# Patient Record
Sex: Female | Born: 1955 | Race: White | Hispanic: No | Smoking: Never smoker
Health system: Southern US, Community
[De-identification: ages and names within clinical notes are randomized; demographics above are authoritative.]

## PROBLEM LIST (undated history)

## (undated) DIAGNOSIS — Z808 Family history of malignant neoplasm of other organs or systems: Secondary | ICD-10-CM

## (undated) DIAGNOSIS — Z923 Personal history of irradiation: Secondary | ICD-10-CM

## (undated) DIAGNOSIS — G43109 Migraine with aura, not intractable, without status migrainosus: Secondary | ICD-10-CM

## (undated) DIAGNOSIS — K589 Irritable bowel syndrome without diarrhea: Secondary | ICD-10-CM

## (undated) DIAGNOSIS — Z800 Family history of malignant neoplasm of digestive organs: Secondary | ICD-10-CM

## (undated) DIAGNOSIS — M1990 Unspecified osteoarthritis, unspecified site: Secondary | ICD-10-CM

## (undated) DIAGNOSIS — C50919 Malignant neoplasm of unspecified site of unspecified female breast: Secondary | ICD-10-CM

## (undated) DIAGNOSIS — I4719 Other supraventricular tachycardia: Secondary | ICD-10-CM

## (undated) DIAGNOSIS — I493 Ventricular premature depolarization: Secondary | ICD-10-CM

## (undated) DIAGNOSIS — Z803 Family history of malignant neoplasm of breast: Secondary | ICD-10-CM

## (undated) DIAGNOSIS — I471 Supraventricular tachycardia: Secondary | ICD-10-CM

## (undated) HISTORY — PX: BREAST LUMPECTOMY: SHX2

## (undated) HISTORY — DX: Other supraventricular tachycardia: I47.19

## (undated) HISTORY — DX: Unspecified osteoarthritis, unspecified site: M19.90

## (undated) HISTORY — DX: Migraine with aura, not intractable, without status migrainosus: G43.109

## (undated) HISTORY — DX: Family history of malignant neoplasm of other organs or systems: Z80.8

## (undated) HISTORY — DX: Irritable bowel syndrome, unspecified: K58.9

## (undated) HISTORY — DX: Ventricular premature depolarization: I49.3

## (undated) HISTORY — DX: Family history of malignant neoplasm of breast: Z80.3

## (undated) HISTORY — DX: Family history of malignant neoplasm of digestive organs: Z80.0

## (undated) HISTORY — DX: Supraventricular tachycardia: I47.1

---
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
## 1978-05-22 HISTORY — PX: UPPER GASTROINTESTINAL ENDOSCOPY: SHX188

## 1981-05-22 HISTORY — PX: TUBAL LIGATION: SHX77

## 1999-12-19 ENCOUNTER — Other Ambulatory Visit: Admission: RE | Admit: 1999-12-19 | Discharge: 1999-12-19 | Payer: Self-pay | Admitting: Gynecology

## 2001-01-03 ENCOUNTER — Other Ambulatory Visit: Admission: RE | Admit: 2001-01-03 | Discharge: 2001-01-03 | Payer: Self-pay | Admitting: Gynecology

## 2002-03-25 ENCOUNTER — Other Ambulatory Visit: Admission: RE | Admit: 2002-03-25 | Discharge: 2002-03-25 | Payer: Self-pay | Admitting: Gynecology

## 2003-12-04 ENCOUNTER — Other Ambulatory Visit: Admission: RE | Admit: 2003-12-04 | Discharge: 2003-12-04 | Payer: Self-pay | Admitting: Gynecology

## 2003-12-07 ENCOUNTER — Other Ambulatory Visit: Admission: RE | Admit: 2003-12-07 | Discharge: 2003-12-07 | Payer: Self-pay | Admitting: Gynecology

## 2004-10-03 ENCOUNTER — Other Ambulatory Visit: Admission: RE | Admit: 2004-10-03 | Discharge: 2004-10-03 | Payer: Self-pay | Admitting: Gynecology

## 2005-05-11 ENCOUNTER — Other Ambulatory Visit: Admission: RE | Admit: 2005-05-11 | Discharge: 2005-05-11 | Payer: Self-pay | Admitting: Gynecology

## 2006-02-23 ENCOUNTER — Inpatient Hospital Stay (HOSPITAL_COMMUNITY): Admission: EM | Admit: 2006-02-23 | Discharge: 2006-02-24 | Payer: Self-pay | Admitting: Emergency Medicine

## 2006-02-23 ENCOUNTER — Encounter (INDEPENDENT_AMBULATORY_CARE_PROVIDER_SITE_OTHER): Payer: Self-pay | Admitting: Specialist

## 2006-02-23 HISTORY — PX: APPENDECTOMY: SHX54

## 2006-08-24 ENCOUNTER — Encounter: Admission: RE | Admit: 2006-08-24 | Discharge: 2006-08-24 | Payer: Self-pay | Admitting: Gynecology

## 2007-10-28 ENCOUNTER — Encounter: Admission: RE | Admit: 2007-10-28 | Discharge: 2007-10-28 | Payer: Self-pay | Admitting: Gynecology

## 2009-09-24 ENCOUNTER — Encounter: Admission: RE | Admit: 2009-09-24 | Discharge: 2009-09-24 | Payer: Self-pay | Admitting: Gynecology

## 2010-10-03 ENCOUNTER — Other Ambulatory Visit: Payer: Self-pay | Admitting: Gynecology

## 2010-10-07 NOTE — H&P (Signed)
Samantha Livingston, [name] NO.:  [id number]   MEDICAL RECORD NO.:  [id number]          PATIENT TYPE:  EMS   LOCATION:  MAJO                         FACILITY:  [facility]   PHYSICIAN:  Cherylynn Ridges, M.D.    DATE OF BIRTH:  1955/07/18   DATE OF ADMISSION:  02/23/2006  DATE OF DISCHARGE:                                HISTORY & PHYSICAL   PRIMARY CARE PHYSICIAN:  Lenon Curt. Chilton Si, M.D.   CHIEF COMPLAINT:  Abdominal pain.   HISTORY OF PRESENT ILLNESS:  Samantha Livingston is a 55 year old female patient  with a history of migraines, awakened at 12:00 a.m. this morning with a  sharp periumbilical pain radiating down to the suprapubic region.  She had  intractable nausea and vomiting, so her husband brought her to the ER.  By  this time, her pain had localized to the right lower quadrant.  Workup was  initiated.  Urinalysis was negative.  White count was 11,300.  CT of the  abdomen and pelvis was performed and demonstrated acute appendicitis.  Surgical evaluation has been requested.   REVIEW OF SYSTEMS:  She has had nausea and vomiting, as noted.  She is  having chills.  Her last BM was about 2:00 a.m.  She last ate prior to  midnight.   SOCIAL HISTORY:  No alcohol, no tobacco.  She is married.  She works as a  Veterinary surgeon.   FAMILY HISTORY:  Noncontributory.   PAST MEDICAL HISTORY:  Migraines.   PAST SURGICAL HISTORY:  Tubal ligation.   ALLERGIES:  NKDA.   CURRENT MEDICATIONS:  Maxalt 5 mg p.r.n. migraine.   PHYSICAL EXAMINATION:  GENERAL:  A pleasant female patient complaining of  right lower quadrant abdominal pain with recent nausea and vomiting.  VITAL SIGNS:  Temp 98, BP 105/60, pulse 58 and regular, respirations 18.  NEURO:  Patient is alert and oriented x3.  Moving all extremities x4.  No  focal deficits.  HEENT:  Head is normocephalic.  Sclerae are noninjected.  Oral mucous  membranes are dry.  NECK:  Supple, no adenopathy.  CHEST:  Bilateral lung sounds are clear  to auscultation.  She is on room  air.  CARDIAC:  S1 and S2.  No murmurs, thrills, or gallops.  Pulses regular.  ABDOMEN:  Soft.  Bowel sounds diminished, nondistended.  She is tender in  the right lower quadrant over McBurney's point with guarding or rebound.  EXTREMITIES:  Symmetrical in appearance.  No clubbing, cyanosis or edema.  Pulses palpable.   LABS:  Sodium 139, potassium 4.6, CO2 24, BUN 17, creatinine 1.6.  White  count 11,300, hemoglobin 15.6, platelets 223,000, neutrophils 74%.  Urinalysis negative.   Diagnostic CT of the abdomen and pelvis demonstrates acute appendicitis.  Incidental finding of a 6 mm lung nodule, right lower lobe.  The radiologist  recommends followup in three months and a 4 mm nonspecific right hepatic  lobe hypodensity.   IMPRESSION:  1. Acute appendicitis.  2. Leukocytosis.  3. A 6 mm right lower lobe pulmonary nodule with recommendations to follow      up  in three months.   PLAN:  Will admit the patient to [facility], n.p.o. status.  Unasyn IV empirically.  IV fluids.  Morphine for pain.  Zofran for nausea.  Plans for laparoscopic  appendectomy today.      Allison L. Rennis Harding, N.P.      Cherylynn Ridges, M.D.  Electronically Signed    ALE/MEDQ  D:  02/23/2006  T:  02/24/2006  Job:  [id number]   cc:   Lenon Curt. Chilton Si, M.D.

## 2010-10-07 NOTE — Op Note (Signed)
Samantha Livingston, Samantha Livingston              ACCOUNT NO.:  [id number]   MEDICAL RECORD NO.:  [id number]          PATIENT TYPE:  INP   LOCATION:  5733                         FACILITY:  [facility]   PHYSICIAN:  Cherylynn Ridges, M.D.    DATE OF BIRTH:  Apr 09, 1956   DATE OF PROCEDURE:  02/23/2006  DATE OF DISCHARGE:                                 OPERATIVE REPORT   PREOPERATIVE DIAGNOSIS:  Acute appendicitis.   POSTOPERATIVE DIAGNOSIS:  Acute appendicitis.   PROCEDURE:  Laparoscopic appendectomy.   SURGEON:  Cherylynn Ridges, M.D.   ASSISTANT:  Kelle Darting. Rennis Harding, N.P.   ANESTHESIA:  General endotracheal.   ESTIMATED BLOOD LOSS:  Less than 20 mL.   COMPLICATIONS:  None.   CONDITION:  Stable.   FINDINGS:  Acute suppurative appendicitis without a perforation.   INDICATIONS FOR OPERATION:  The patient is a 55 year old otherwise healthy  female with abdominal pain localized to the right lower quadrant who comes  in with acute appendicitis.  This was diagnosed by CT scan.   FINDINGS:  The patient had acute suppurative appendicitis without  perforation.   OPERATION:  The patient was taken to the operating room and placed on the  table in the supine position.  After an adequate endotracheal anesthetic was  administered, she was prepped and draped in the usual sterile manner,  exposing the midline and the right upper quadrant.   A supraumbilical curvilinear incision was made using a #11 blade and taken  down to the midline fascia.  We grabbed the fascia with the Kocher clamps  and then made a fascial incision between the clamps down into the  preperitoneal space.  We opened into the peritoneal cavity bluntly using a  Kelly clamp and then passed an 0 Vicryl pursestring suture around the  fascial opening in which we secured the Hassan cannula which was  subsequently passed without event.  Through the [facility] cannula, carbon  dioxide gas was insufflated up to maximal intra-abdominal pressure of 15  mmHg.  Once this was done, two right costal margin 5-mm cannula and a  suprapubic 12-mm cannula were passed under direct vision.  Once all cannulas  were in place, the patient was placed into  Trendelenburg, and the left side  was tilted down.   The acutely inflamed appendix could be seen coursing along the medial aspect  the pelvic brim.  We were able to mobilize it up at the cecal base where  after creating a window between the mesoappendix and appendix, we passed an  Endo GIA 3.5-mm closure stapler across the base.  Once this was done, a 2.5-  mm closure Endo-GIA was passed across the mesoappendix, with some smaller  vessels being cauterized with the grasper.  Once the appendix was completely  detached, we were able to pull it out through the suprapubic cannula site  using an EndoCatch bag without contamination of the skin and subcutaneous  tissue.   Once this was done, we reinserted the suprapubic cannula, irrigated with  saline solution.  Up to a liter and a half of solution was used.  Once  this  was done, we aspirated above the liver and in the pelvis of all fluid and  removed all cannulas.  The supraumbilical site was closed at the fascial  level using the 0 Vicryl suture which was then placed to secure the cannula.  Once that was done, the skin at all sites was closed using running  subcuticular stitch of 5-0 Vicryl, and 0.25% Marcaine with epinephrine was  injected at all sites.  Sterile dressings were applied.      Cherylynn Ridges, M.D.  Electronically Signed     JOW/MEDQ  D:  02/23/2006  T:  02/25/2006  Job:  [id number]

## 2011-03-16 ENCOUNTER — Other Ambulatory Visit: Payer: Self-pay | Admitting: Gynecology

## 2012-01-04 ENCOUNTER — Other Ambulatory Visit: Payer: Self-pay | Admitting: Gynecology

## 2012-01-04 DIAGNOSIS — R928 Other abnormal and inconclusive findings on diagnostic imaging of breast: Secondary | ICD-10-CM

## 2012-01-04 LAB — HM MAMMOGRAPHY: HM Mammogram: ABNORMAL

## 2012-01-05 ENCOUNTER — Ambulatory Visit
Admission: RE | Admit: 2012-01-05 | Discharge: 2012-01-05 | Disposition: A | Discharge status: Home / Self Care | Payer: BC Managed Care – PPO | Source: Ambulatory Visit | Attending: Gynecology | Admitting: Gynecology

## 2012-01-05 ENCOUNTER — Other Ambulatory Visit: Payer: Self-pay

## 2012-01-05 DIAGNOSIS — R928 Other abnormal and inconclusive findings on diagnostic imaging of breast: Secondary | ICD-10-CM

## 2012-01-10 ENCOUNTER — Other Ambulatory Visit: Payer: Self-pay

## 2013-02-10 ENCOUNTER — Other Ambulatory Visit: Payer: Self-pay | Admitting: Gynecology

## 2013-02-13 ENCOUNTER — Other Ambulatory Visit: Payer: Self-pay | Admitting: Physician Assistant

## 2013-02-25 ENCOUNTER — Encounter: Payer: Self-pay | Admitting: Internal Medicine

## 2013-02-26 ENCOUNTER — Ambulatory Visit (INDEPENDENT_AMBULATORY_CARE_PROVIDER_SITE_OTHER): Payer: BC Managed Care – PPO | Admitting: Internal Medicine

## 2013-02-26 ENCOUNTER — Encounter: Payer: Self-pay | Admitting: Internal Medicine

## 2013-02-26 VITALS — BP 126/84 | HR 73 | Wt 133.0 lb

## 2013-02-26 DIAGNOSIS — G4700 Insomnia, unspecified: Secondary | ICD-10-CM

## 2013-02-26 DIAGNOSIS — L299 Pruritus, unspecified: Secondary | ICD-10-CM

## 2013-02-26 DIAGNOSIS — E785 Hyperlipidemia, unspecified: Secondary | ICD-10-CM

## 2013-02-26 DIAGNOSIS — Z23 Encounter for immunization: Secondary | ICD-10-CM

## 2013-02-26 DIAGNOSIS — G43909 Migraine, unspecified, not intractable, without status migrainosus: Secondary | ICD-10-CM

## 2013-02-26 MED ORDER — CETIRIZINE HCL 10 MG PO TABS: 10.0000 mg | ORAL_TABLET | Freq: Every day | ORAL | Status: DC

## 2013-02-26 MED ORDER — DIPHENHYDRAMINE HCL 25 MG PO TABS: ORAL_TABLET | ORAL | Status: DC

## 2013-02-26 NOTE — Progress Notes (Signed)
Subjective:    Patient ID: Samantha Livingston, female    DOB: 11-18-1955, 57 y.o.   MRN: [id number]  Chief Complaint  Patient presents with  . Medication Refill    renew migraine medicaiton   . Pruritis    back and lower legs x 4-5 weeks, seen dermatologist- was rx'ed prednisone (completed course)     HPI Pruritus without rash. Benadryl helps at night.  Having frequent night sweats. No fever.  Current Outpatient Prescriptions on File Prior to Visit  Medication Sig Dispense Refill  . Multiple Vitamin (MULTIVITAMIN) tablet Take 1 tablet by mouth daily.      . rizatriptan (MAXALT) 10 MG tablet Take 10 mg by mouth as needed for migraine. May repeat in 2 hours if needed       No current facility-administered medications on file prior to visit.   Immunization History  Administered Date(s) Administered  . Influenza,inj,Quad PF,36+ Mos 02/26/2013  . Influenza-Generic 02/07/2011, 02/27/2012   Past Medical History  Diagnosis Date  . Migraine with aura, without mention of intractable migraine without mention of status migrainosus   . Irritable bowel syndrome    Past Surgical History  Procedure Laterality Date  . Tonsillectomy  1969  . Tubal ligation  1983  . Appendectomy  02-23-2006  . Upper gastrointestinal endoscopy  1980   PROCEDURES 1980 upper GI: Hypersecretion March 2005 CT abdomen and pelvis: Benign renal cyst, ovarian cyst April 2005 renal ultrasound: Right renal cyst 2005 MRI brain: Normal  06/07/2004 2-D echocardiogram: Ejection fraction greater than 55% 2006 Holter: Unremarkable 2006 stress Cardiolite: Normal 02/23/2006 CT abdomen: Acute appendicitis  CONSULTANTS Cardiology: [facility] Headache: Neale Burly GYNGreta Doom  History   Social History  . Marital Status: Unknown    Spouse Name: N/A    Number of Children: N/A  . Years of Education: N/A   Occupational History  . Not on file.   Social History Main Topics  . Smoking status: Never Smoker   . Smokeless  tobacco: Not on file  . Alcohol Use: No  . Drug Use: No  . Sexual Activity: Not on file   Other Topics Concern  . Not on file   Social History Narrative  . No narrative on file    Review of Systems  Constitutional: Positive for fatigue.  HENT: Negative.   Eyes: Negative.   Respiratory: Negative.   Cardiovascular: Negative.   Gastrointestinal: Negative.   Endocrine: Negative.   Genitourinary: Negative.   Musculoskeletal: Negative.   Skin:       Generalized itching. No rash or sores. Has seen Dr. Terri Piedra.  Allergic/Immunologic: Negative.   Neurological:       History migraine headache. Currently controlled.  Hematological: Negative.   Psychiatric/Behavioral: Negative.        Objective:BP 126/84  Pulse 73  Wt 133 lb (60.328 kg)  SpO2 98%    Physical Exam  Constitutional: She is oriented to person, place, and time. She appears well-nourished. No distress.  HENT:  Head: Normocephalic and atraumatic.  Right Ear: External ear normal.  Left Ear: External ear normal.  Nose: Nose normal.  Mouth/Throat: Oropharynx is clear and moist.  Eyes: Conjunctivae and EOM are normal. Pupils are equal, round, and reactive to light.  Neck: No JVD present. No tracheal deviation present. No thyromegaly present.  Cardiovascular: Normal rate, regular rhythm, normal heart sounds and intact distal pulses.  Exam reveals no gallop and no friction rub.   No murmur heard. Pulmonary/Chest: No respiratory distress. She has no wheezes.  She has no rales.  Abdominal: She exhibits distension. She exhibits no mass. There is no tenderness.  Musculoskeletal: Normal range of motion. She exhibits edema. She exhibits no tenderness.  Lymphadenopathy:    She has no cervical adenopathy.  Neurological: She is alert and oriented to person, place, and time. No cranial nerve deficit. Coordination normal.  Skin: No rash noted. No erythema. No pallor.  Psychiatric: She has a normal mood and affect. Her behavior is  normal. Judgment and thought content normal.     02/24/2005 EKG: Rate 58. Normal sinus rhythm. Normal EKG. 03/19/2012 CBC: Normal  CMP: Normal  Lipids: TC 224, trig 93, HDL 63, LDL 142    Assessment & Plan:  Pruritus - Plan: cetirizine (ZYRTEC) 10 MG tablet, CBC With differential/Platelet, Comprehensive metabolic panel, TSH, Sedimentation Rate  Migraine, unspecified, without mention of intractable migraine without mention of status migrainosus  Insomnia, unspecified - Plan: diphenhydrAMINE (BENADRYL) 25 MG tablet  Need for prophylactic vaccination and inoculation against influenza: administered  Hyperlipidemia: Recheck lab at future visit.

## 2013-02-26 NOTE — Patient Instructions (Signed)
Try Zyrtec (cetirizine) for itching.

## 2013-02-27 LAB — CBC WITH DIFFERENTIAL
Basos: 0 %
Eos: 1 %
HCT: 43 % (ref 34.0–46.6)
Hemoglobin: 14.9 g/dL (ref 11.1–15.9)
Lymphocytes Absolute: 3.2 10*3/uL — ABNORMAL HIGH (ref 0.7–3.1)
MCH: 29.9 pg (ref 26.6–33.0)
MCV: 86 fL (ref 79–97)
Monocytes Absolute: 0.6 10*3/uL (ref 0.1–0.9)
Monocytes: 7 %
Neutrophils Absolute: 4.5 10*3/uL (ref 1.4–7.0)
Platelets: 321 10*3/uL (ref 150–379)
RBC: 4.99 x10E6/uL (ref 3.77–5.28)

## 2013-02-27 LAB — COMPREHENSIVE METABOLIC PANEL
AST: 15 IU/L (ref 0–40)
Albumin/Globulin Ratio: 2.2 (ref 1.1–2.5)
Albumin: 4.8 g/dL (ref 3.5–5.5)
Alkaline Phosphatase: 85 IU/L (ref 39–117)
BUN/Creatinine Ratio: 16 (ref 9–23)
BUN: 13 mg/dL (ref 6–24)
Creatinine, Ser: 0.83 mg/dL (ref 0.57–1.00)
GFR calc non Af Amer: 80 mL/min/{1.73_m2} (ref >59)
Globulin, Total: 2.2 g/dL (ref 1.5–4.5)
Sodium: 141 mmol/L (ref 134–144)

## 2013-02-27 LAB — SEDIMENTATION RATE: Sed Rate: 5 mm/hr (ref 0–40)

## 2013-03-07 DIAGNOSIS — G43909 Migraine, unspecified, not intractable, without status migrainosus: Secondary | ICD-10-CM | POA: Insufficient documentation

## 2013-03-07 DIAGNOSIS — E785 Hyperlipidemia, unspecified: Secondary | ICD-10-CM | POA: Insufficient documentation

## 2013-03-07 DIAGNOSIS — L299 Pruritus, unspecified: Secondary | ICD-10-CM | POA: Insufficient documentation

## 2013-04-04 ENCOUNTER — Other Ambulatory Visit: Payer: Self-pay | Admitting: Nurse Practitioner

## 2013-04-22 ENCOUNTER — Other Ambulatory Visit: Payer: Self-pay | Admitting: Physician Assistant

## 2013-10-16 DIAGNOSIS — L908 Other atrophic disorders of skin: Secondary | ICD-10-CM

## 2013-10-16 DIAGNOSIS — L988 Other specified disorders of the skin and subcutaneous tissue: Secondary | ICD-10-CM | POA: Insufficient documentation

## 2013-10-24 ENCOUNTER — Other Ambulatory Visit: Payer: Self-pay | Admitting: *Deleted

## 2013-10-24 DIAGNOSIS — Z0000 Encounter for general adult medical examination without abnormal findings: Secondary | ICD-10-CM

## 2013-11-14 ENCOUNTER — Other Ambulatory Visit: Payer: BC Managed Care – PPO

## 2013-11-14 DIAGNOSIS — Z0000 Encounter for general adult medical examination without abnormal findings: Secondary | ICD-10-CM

## 2013-11-15 LAB — CBC WITH DIFFERENTIAL/PLATELET
Basophils Absolute: 0.1 10*3/uL (ref 0.0–0.2)
Basos: 1 %
EOS: 2 %
Eosinophils Absolute: 0.2 10*3/uL (ref 0.0–0.4)
HEMATOCRIT: 41.3 % (ref 34.0–46.6)
Hemoglobin: 14.7 g/dL (ref 11.1–15.9)
IMMATURE GRANULOCYTES: 0 %
Immature Grans (Abs): 0 10*3/uL (ref 0.0–0.1)
Lymphocytes Absolute: 2.3 10*3/uL (ref 0.7–3.1)
Lymphs: 31 %
MCH: 30.4 pg (ref 26.6–33.0)
MCHC: 35.6 g/dL (ref 31.5–35.7)
MCV: 85 fL (ref 79–97)
MONOCYTES: 7 %
Monocytes Absolute: 0.6 10*3/uL (ref 0.1–0.9)
Neutrophils Absolute: 4.4 10*3/uL (ref 1.4–7.0)
Neutrophils Relative %: 59 %
RBC: 4.84 x10E6/uL (ref 3.77–5.28)
RDW: 13.6 % (ref 12.3–15.4)
WBC: 7.5 10*3/uL (ref 3.4–10.8)

## 2013-11-15 LAB — COMPREHENSIVE METABOLIC PANEL
ALBUMIN: 4.7 g/dL (ref 3.5–5.5)
ALK PHOS: 104 IU/L (ref 39–117)
ALT: 17 IU/L (ref 0–32)
AST: 16 IU/L (ref 0–40)
Albumin/Globulin Ratio: 2.1 (ref 1.1–2.5)
BUN / CREAT RATIO: 20 (ref 9–23)
BUN: 16 mg/dL (ref 6–24)
CHLORIDE: 101 mmol/L (ref 97–108)
CO2: 26 mmol/L (ref 18–29)
Calcium: 9.3 mg/dL (ref 8.7–10.2)
Creatinine, Ser: 0.82 mg/dL (ref 0.57–1.00)
GFR calc Af Amer: 93 mL/min/{1.73_m2} (ref >59)
GFR calc non Af Amer: 81 mL/min/{1.73_m2} (ref >59)
Globulin, Total: 2.2 g/dL (ref 1.5–4.5)
Glucose: 83 mg/dL (ref 65–99)
Potassium: 4.3 mmol/L (ref 3.5–5.2)
Sodium: 141 mmol/L (ref 134–144)
Total Bilirubin: 0.6 mg/dL (ref 0.0–1.2)
Total Protein: 6.9 g/dL (ref 6.0–8.5)

## 2013-11-15 LAB — LIPID PANEL
Chol/HDL Ratio: 3.8 ratio units (ref 0.0–4.4)
Cholesterol, Total: 269 mg/dL — ABNORMAL HIGH (ref 100–199)
HDL: 70 mg/dL (ref >39)
LDL Calculated: 176 mg/dL — ABNORMAL HIGH (ref 0–99)
TRIGLYCERIDES: 113 mg/dL (ref 0–149)
VLDL Cholesterol Cal: 23 mg/dL (ref 5–40)

## 2013-11-18 ENCOUNTER — Ambulatory Visit (INDEPENDENT_AMBULATORY_CARE_PROVIDER_SITE_OTHER): Payer: BC Managed Care – PPO | Admitting: Internal Medicine

## 2013-11-18 ENCOUNTER — Encounter: Payer: Self-pay | Admitting: Internal Medicine

## 2013-11-18 VITALS — BP 120/80 | HR 67 | Temp 98.0°F | Resp 18 | Wt 135.2 lb

## 2013-11-18 DIAGNOSIS — Z13820 Encounter for screening for osteoporosis: Secondary | ICD-10-CM

## 2013-11-18 DIAGNOSIS — E785 Hyperlipidemia, unspecified: Secondary | ICD-10-CM

## 2013-11-18 DIAGNOSIS — G43909 Migraine, unspecified, not intractable, without status migrainosus: Secondary | ICD-10-CM

## 2013-11-18 DIAGNOSIS — L299 Pruritus, unspecified: Secondary | ICD-10-CM

## 2013-11-18 DIAGNOSIS — Z1211 Encounter for screening for malignant neoplasm of colon: Secondary | ICD-10-CM

## 2013-11-18 MED ORDER — LORAZEPAM 1 MG PO TABS: ORAL_TABLET | ORAL | Status: DC

## 2013-11-18 MED ORDER — TRIAMCINOLONE ACETONIDE 0.1 % EX CREA: TOPICAL_CREAM | CUTANEOUS | Status: DC | PRN

## 2013-11-18 MED ORDER — RIZATRIPTAN BENZOATE 10 MG PO TABS: ORAL_TABLET | ORAL | Status: DC

## 2013-11-18 MED ORDER — PRAVASTATIN SODIUM 20 MG PO TABS: ORAL_TABLET | ORAL | Status: DC

## 2013-11-18 NOTE — Progress Notes (Signed)
Patient ID: Samantha Livingston, female   DOB: 01-07-1956, 58 y.o.   MRN: [id number]    Location:    [facility]  Place of Service:  OFFICE  Extended Emergency Contact Information Primary Emergency Contact: [facility] Address: [place],  [place] Home Phone: [phone] Relation: None   Chief Complaint  Patient presents with  . Annual Exam    HPI:  Patient feels like she been doing well physically. Her husband died a few months ago. She is still grieving, but is not overly depressed. He had been ill for many months and his death was expected.  Hyperlipidemia - never treated in the past. History of CVC in mother and maternal GF, MI in maternal GM. Multiple family members with known HLD.  Migraine, unspecified, without mention of intractable migraine without mention of status migrainosus - headaches are not as frequent as in the past. Maxalt is still effective.  Pruritus - improved. Continues to use triamcinolone cream (KENALOG) 0.1 % if she feels the itching is relapsing.  Has never had colonoscopy  Has never had Bone Density    Past Medical History  Diagnosis Date  . Migraine with aura, without mention of intractable migraine without mention of status migrainosus   . Irritable bowel syndrome     Past Surgical History  Procedure Laterality Date  . Tonsillectomy  1969  . Tubal ligation  1983  . Appendectomy  02-23-2006  . Upper gastrointestinal endoscopy  1980    History   Social History  . Marital Status: Unknown    Spouse Name: N/A    Number of Children: N/A  . Years of Education: N/A   Occupational History  . Not on file.   Social History Main Topics  . Smoking status: Never Smoker   . Smokeless tobacco: Not on file  . Alcohol Use: No  . Drug Use: No  . Sexual Activity: Not on file   Other Topics Concern  . Not on file   Social History Narrative   Husband died 46 after a prolonged illness. Married 20 years. Her 2nd marriage and his  37th.   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     reports that she has never smoked. She does not have any smokeless tobacco history on file. She reports that she does not drink alcohol or use illicit drugs.  Immunization History  Administered Date(s) Administered  . Influenza,inj,Quad PF,36+ Mos 02/26/2013  . Influenza-Unspecified 02/07/2011, 02/27/2012    No Known Allergies  Medications: Patient's Medications  New Prescriptions   No medications on file  Previous Medications   LORAZEPAM (ATIVAN) 1 MG TABLET    Take 1 mg by mouth as needed.    MULTIPLE VITAMIN (MULTIVITAMIN) TABLET    Take 1 tablet by mouth daily.   RIZATRIPTAN (MAXALT) 10 MG TABLET    TAKE 1 TABLET AT ONSET OF MIGRAINE HEADACHES   TRIAMCINOLONE CREAM (KENALOG) 0.1 %      Modified Medications   No medications on file  Discontinued Medications   CETIRIZINE (ZYRTEC) 10 MG TABLET    Take 1 tablet (10 mg total) by mouth daily.   DIPHENHYDRAMINE (BENADRYL) 25 MG TABLET    One at bed if needed for rest   HYDROXYZINE (ATARAX/VISTARIL) 10 MG TABLET       METRONIDAZOLE (FLAGYL) 500 MG TABLET         Review of Systems  Constitutional: Positive for fatigue.  HENT: Negative.   Eyes: Negative.   Respiratory: Negative.   Cardiovascular: Negative.   Gastrointestinal: Negative.   Endocrine: Negative.   Genitourinary: Negative.   Musculoskeletal: Negative.   Skin:       Generalized itching. No rash or sores. Has seen Dr. Allyson Sabal.  Allergic/Immunologic: Negative.   Neurological:       History migraine headache. Currently controlled.  Hematological: Negative.   Psychiatric/Behavioral: Negative.     Filed Vitals:   11/18/13 1406  BP: 120/80  Pulse: 67  Temp: 98 F (36.7 C)  TempSrc: Oral  Resp: 18  Weight: 135 lb 3.2 oz (61.326 kg)  SpO2: 99%   There is no height on file to calculate BMI.  Physical Exam  Constitutional: She is oriented to person, place, and time. She appears  well-nourished. No distress.  HENT:  Head: Normocephalic and atraumatic.  Right Ear: External ear normal.  Left Ear: External ear normal.  Nose: Nose normal.  Mouth/Throat: Oropharynx is clear and moist.  Eyes: Conjunctivae and EOM are normal. Pupils are equal, round, and reactive to light.  Neck: No JVD present. No tracheal deviation present. No thyromegaly present.  Cardiovascular: Normal rate, regular rhythm, normal heart sounds and intact distal pulses.  Exam reveals no gallop and no friction rub.   No murmur heard. Pulmonary/Chest: No respiratory distress. She has no wheezes. She has no rales.  Abdominal: She exhibits no distension and no mass. There is no tenderness.  Genitourinary:  Done by GYN  Musculoskeletal: Normal range of motion. She exhibits no edema and no tenderness.  Lymphadenopathy:    She has no cervical adenopathy.  Neurological: She is alert and oriented to person, place, and time. No cranial nerve deficit. Coordination normal.  Skin: No rash noted. No erythema. No pallor.  Psychiatric: She has a normal mood and affect. Her behavior is normal. Judgment and thought content normal.     Labs reviewed: Appointment on 11/14/2013  Component Date Value Ref Range Status  . Glucose 11/14/2013 83  65 - 99 mg/dL Final  . BUN 11/14/2013 16  6 - 24 mg/dL Final  . Creatinine, Ser 11/14/2013 0.82  0.57 - 1.00 mg/dL Final  . GFR calc non Af Amer 11/14/2013 81  >59 mL/min/1.73 Final  . GFR calc Af Amer 11/14/2013 93  >59 mL/min/1.73 Final  . BUN/Creatinine Ratio 11/14/2013 20  9 - 23 Final  . Sodium 11/14/2013 141  134 - 144 mmol/L Final  . Potassium 11/14/2013 4.3  3.5 - 5.2 mmol/L Final  . Chloride 11/14/2013 101  97 - 108 mmol/L Final  . CO2 11/14/2013 26  18 - 29 mmol/L Final  . Calcium 11/14/2013 9.3  8.7 - 10.2 mg/dL Final  . Total Protein 11/14/2013 6.9  6.0 - 8.5 g/dL Final  . Albumin 11/14/2013 4.7  3.5 - 5.5 g/dL Final  . Globulin, Total 11/14/2013 2.2  1.5 - 4.5  g/dL Final  . Albumin/Globulin Ratio 11/14/2013 2.1  1.1 - 2.5 Final  . Total Bilirubin 11/14/2013 0.6  0.0 - 1.2 mg/dL Final  . Alkaline Phosphatase 11/14/2013 104  39 - 117 IU/L Final  . AST 11/14/2013 16  0 - 40 IU/L Final  . ALT 11/14/2013 17  0 - 32 IU/L Final  . WBC 11/14/2013 7.5  3.4 - 10.8 x10E3/uL Final  . RBC 11/14/2013 4.84  3.77 - 5.28 x10E6/uL Final  . Hemoglobin 11/14/2013 14.7  11.1 - 15.9 g/dL Final  . HCT 11/14/2013 41.3  34.0 -  46.6 % Final  . MCV 11/14/2013 85  79 - 97 fL Final  . MCH 11/14/2013 30.4  26.6 - 33.0 pg Final  . MCHC 11/14/2013 35.6  31.5 - 35.7 g/dL Final  . RDW 11/14/2013 13.6  12.3 - 15.4 % Final  . Neutrophils Relative % 11/14/2013 59   Final  . Lymphs 11/14/2013 31   Final  . Monocytes 11/14/2013 7   Final  . Eos 11/14/2013 2   Final  . Basos 11/14/2013 1   Final  . Neutrophils Absolute 11/14/2013 4.4  1.4 - 7.0 x10E3/uL Final  . Lymphocytes Absolute 11/14/2013 2.3  0.7 - 3.1 x10E3/uL Final  . Monocytes Absolute 11/14/2013 0.6  0.1 - 0.9 x10E3/uL Final  . Eosinophils Absolute 11/14/2013 0.2  0.0 - 0.4 x10E3/uL Final  . Basophils Absolute 11/14/2013 0.1  0.0 - 0.2 x10E3/uL Final  . Immature Granulocytes 11/14/2013 0   Final  . Immature Grans (Abs) 11/14/2013 0.0  0.0 - 0.1 x10E3/uL Final  . Cholesterol, Total 11/14/2013 269* 100 - 199 mg/dL Final  . Triglycerides 11/14/2013 113  0 - 149 mg/dL Final  . HDL 11/14/2013 70  >39 mg/dL Final   Comment: According to ATP-III Guidelines, HDL-C >59 mg/dL is considered a                          negative risk factor for CHD.  Marland Kitchen VLDL Cholesterol Cal 11/14/2013 23  5 - 40 mg/dL Final  . LDL Calculated 11/14/2013 176* 0 - 99 mg/dL Final  . Chol/HDL Ratio 11/14/2013 3.8  0.0 - 4.4 ratio units Final   Comment:                                   T. Chol/HDL Ratio                                                                      Men  Women                                                        1/2 Avg.Risk   3.4    3.3                                                            Avg.Risk  5.0    4.4                                                         2X Avg.Risk  9.6    7.1  3X Avg.Risk 23.4   11.0    11/18/13 EKG: rate 60. NSR. Normal EKG  Assessment/Plan   1. Hyperlipidemia Start treatment - pravastatin (PRAVACHOL) 20 MG tablet; One daily to lower cholesterol  Dispense: 90 tablet; Refill: 3 - Lipid panel; Future  2. Migraine, unspecified, without mention of intractable migraine without mention of status migrainosus - rizatriptan (MAXALT) 10 MG tablet; TAKE 1 TABLET AT ONSET OF MIGRAINE HEADACHES  Dispense: 8 tablet; Refill: 5 - LORazepam (ATIVAN) 1 MG tablet; One up to 3 times daily if needed for anxiety  Dispense: 30 tablet; Refill: 4  3. Pruritus - triamcinolone cream (KENALOG) 0.1 %; Apply topically as needed.  Dispense: 30 g; Refill: 2  4. Encounter for screening colonoscopy - Ambulatory referral to Gastroenterology  5. Osteoporosis screening - DG Bone Density; Future

## 2013-11-19 ENCOUNTER — Encounter: Payer: Self-pay | Admitting: Internal Medicine

## 2013-12-05 ENCOUNTER — Encounter: Payer: Self-pay | Admitting: Internal Medicine

## 2013-12-09 ENCOUNTER — Telehealth: Payer: Self-pay | Admitting: *Deleted

## 2013-12-09 NOTE — Telephone Encounter (Signed)
Dr. Nyoka Cowden received a note from [facility] stating they have not been able to get into contact with patient to schedule a colonoscopy. [facility] stated that patient will not return their calls. I called patient and spoke with her regarding this and she stated [place] and [place] them and schedule an appointment.

## 2014-02-13 ENCOUNTER — Other Ambulatory Visit: Payer: BC Managed Care – PPO

## 2014-02-17 ENCOUNTER — Ambulatory Visit: Payer: BC Managed Care – PPO | Admitting: Internal Medicine

## 2014-02-19 ENCOUNTER — Other Ambulatory Visit: Payer: Self-pay | Admitting: Gynecology

## 2014-02-23 LAB — CYTOLOGY - PAP

## 2014-03-27 ENCOUNTER — Other Ambulatory Visit: Payer: BC Managed Care – PPO

## 2014-03-27 DIAGNOSIS — E785 Hyperlipidemia, unspecified: Secondary | ICD-10-CM

## 2014-03-28 LAB — LIPID PANEL
CHOL/HDL RATIO: 4.2 ratio (ref 0.0–4.4)
Cholesterol, Total: 261 mg/dL — ABNORMAL HIGH (ref 100–199)
HDL: 62 mg/dL (ref >39)
LDL Calculated: 167 mg/dL — ABNORMAL HIGH (ref 0–99)
TRIGLYCERIDES: 160 mg/dL — AB (ref 0–149)
VLDL CHOLESTEROL CAL: 32 mg/dL (ref 5–40)

## 2014-03-31 ENCOUNTER — Encounter: Payer: Self-pay | Admitting: Internal Medicine

## 2014-03-31 ENCOUNTER — Ambulatory Visit (INDEPENDENT_AMBULATORY_CARE_PROVIDER_SITE_OTHER): Payer: BC Managed Care – PPO | Admitting: Internal Medicine

## 2014-03-31 ENCOUNTER — Ambulatory Visit (INDEPENDENT_AMBULATORY_CARE_PROVIDER_SITE_OTHER): Payer: BC Managed Care – PPO | Admitting: *Deleted

## 2014-03-31 VITALS — BP 116/70 | HR 66 | Temp 97.7°F | Resp 10 | Wt 139.0 lb

## 2014-03-31 DIAGNOSIS — E785 Hyperlipidemia, unspecified: Secondary | ICD-10-CM

## 2014-03-31 DIAGNOSIS — G43009 Migraine without aura, not intractable, without status migrainosus: Secondary | ICD-10-CM

## 2014-03-31 DIAGNOSIS — Z23 Encounter for immunization: Secondary | ICD-10-CM

## 2014-03-31 DIAGNOSIS — L299 Pruritus, unspecified: Secondary | ICD-10-CM

## 2014-03-31 MED ORDER — RIZATRIPTAN BENZOATE 10 MG PO TABS: ORAL_TABLET | ORAL | Status: DC

## 2014-03-31 MED ORDER — PRAVASTATIN SODIUM 20 MG PO TABS: ORAL_TABLET | ORAL | Status: DC

## 2014-03-31 NOTE — Progress Notes (Signed)
Patient ID: Samantha Livingston, female   DOB: [birth year], 58 y.o.   MRN: [id number]    Facility  [facility]    Place of Service:   OFFICE   No Known Allergies  Chief Complaint  Patient presents with  . Medical Management of Chronic Issues    3 Month follow-up     HPI:  Hyperlipidemia: She did not take pravastatin. Her brother did not tolerate Crestor due to muscle aches.  Migraine without aura and without status migrainosus, not intractable: improved  Pruritus: improved    Medications: Patient's Medications  New Prescriptions   No medications on file  Previous Medications   LORAZEPAM (ATIVAN) 1 MG TABLET    One up to 3 times daily if needed for anxiety   MULTIPLE VITAMIN (MULTIVITAMIN) TABLET    Take 1 tablet by mouth daily.   PRAVASTATIN (PRAVACHOL) 20 MG TABLET    One daily to lower cholesterol   RIZATRIPTAN (MAXALT) 10 MG TABLET    TAKE 1 TABLET AT ONSET OF MIGRAINE HEADACHES   TRIAMCINOLONE CREAM (KENALOG) 0.1 %    Apply topically as needed.  Modified Medications   No medications on file  Discontinued Medications   No medications on file     Review of Systems  Constitutional: Positive for fatigue.  HENT: Negative.   Eyes: Negative.   Respiratory: Negative.   Cardiovascular: Negative.   Gastrointestinal: Negative.   Endocrine: Negative.   Genitourinary: Negative.   Musculoskeletal: Negative.   Skin:       Generalized itching. No rash or sores. Has seen Dr. Allyson Sabal.  Allergic/Immunologic: Negative.   Neurological:       History migraine headache. Currently controlled.  Hematological: Negative.   Psychiatric/Behavioral: Negative.     Filed Vitals:   03/31/14 1349  BP: 116/70  Pulse: 66  Temp: 97.7 F (36.5 C)  TempSrc: Oral  Resp: 10  Weight: 139 lb (63.05 kg)  SpO2: 99%   There is no height on file to calculate BMI.  Physical Exam  Constitutional: She is oriented to person, place, and time. She appears well-nourished. No distress.  HENT:  Head:  Normocephalic and atraumatic.  Right Ear: External ear normal.  Left Ear: External ear normal.  Nose: Nose normal.  Mouth/Throat: Oropharynx is clear and moist.  Eyes: Conjunctivae and EOM are normal. Pupils are equal, round, and reactive to light.  Neck: No JVD present. No tracheal deviation present. No thyromegaly present.  Cardiovascular: Normal rate, regular rhythm, normal heart sounds and intact distal pulses.  Exam reveals no gallop and no friction rub.   No murmur heard. Pulmonary/Chest: No respiratory distress. She has no wheezes. She has no rales.  Abdominal: She exhibits no distension and no mass. There is no tenderness.  Genitourinary:  Done by GYN  Musculoskeletal: Normal range of motion. She exhibits no edema or tenderness.  Lymphadenopathy:    She has no cervical adenopathy.  Neurological: She is alert and oriented to person, place, and time. No cranial nerve deficit. Coordination normal.  Skin: No rash noted. No erythema. No pallor.  Psychiatric: She has a normal mood and affect. Her behavior is normal. Judgment and thought content normal.     Labs reviewed: Appointment on 03/27/2014  Component Date Value Ref Range Status  . Cholesterol, Total 03/27/2014 261* 100 - 199 mg/dL Final                 **Please note reference interval change**  . Triglycerides 03/27/2014 160* 0 - 149  mg/dL Final                 **Please note reference interval change**  . HDL 03/27/2014 62  >39 mg/dL Final   Comment: According to ATP-III Guidelines, HDL-C >59 mg/dL is considered a negative risk factor for CHD.   Marland Kitchen VLDL Cholesterol Cal 03/27/2014 32  5 - 40 mg/dL Final  . LDL Calculated 03/27/2014 167* 0 - 99 mg/dL Final                 **Please note reference interval change**  . Chol/HDL Ratio 03/27/2014 4.2  0.0 - 4.4 ratio units Final   Comment:                                   T. Chol/HDL Ratio                                             Men  Women                                1/2 Avg.Risk  3.4    3.3                                   Avg.Risk  5.0    4.4                                2X Avg.Risk  9.6    7.1                                3X Avg.Risk 23.4   11.0   Orders Only on 02/19/2014  Component Date Value Ref Range Status  . CYTOLOGY - PAP 02/19/2014 PAP RESULT   Final     Assessment/Plan  1. Hyperlipidemia Gave samples of Livalo to see if she tolerates statins. Switch to pravastain if she tolerates livalo. - pravastatin (PRAVACHOL) 20 MG tablet; One daily to lower cholesterol  Dispense: 30 tablet; Refill: 3 - Lipid panel; Future  2. Migraine without aura and without status migrainosus, not intractable - rizatriptan (MAXALT) 10 MG tablet; TAKE 1 TABLET AT ONSET OF MIGRAINE HEADACHES  Dispense: 8 tablet; Refill: 5  3. Pruritus improved

## 2014-05-12 ENCOUNTER — Other Ambulatory Visit: Payer: Self-pay | Admitting: Obstetrics & Gynecology

## 2014-09-28 ENCOUNTER — Other Ambulatory Visit: Payer: BC Managed Care – PPO

## 2014-09-29 ENCOUNTER — Ambulatory Visit: Payer: BC Managed Care – PPO | Admitting: Internal Medicine

## 2014-12-24 ENCOUNTER — Other Ambulatory Visit: Payer: Self-pay | Admitting: Internal Medicine

## 2015-01-26 ENCOUNTER — Ambulatory Visit (INDEPENDENT_AMBULATORY_CARE_PROVIDER_SITE_OTHER): Payer: Self-pay | Admitting: Internal Medicine

## 2015-01-26 ENCOUNTER — Encounter: Payer: Self-pay | Admitting: Internal Medicine

## 2015-01-26 VITALS — BP 110/74 | HR 61 | Temp 98.1°F | Resp 14 | Ht 66.0 in | Wt 136.0 lb

## 2015-01-26 DIAGNOSIS — Z803 Family history of malignant neoplasm of breast: Secondary | ICD-10-CM

## 2015-01-26 DIAGNOSIS — E785 Hyperlipidemia, unspecified: Secondary | ICD-10-CM

## 2015-01-26 DIAGNOSIS — G43009 Migraine without aura, not intractable, without status migrainosus: Secondary | ICD-10-CM

## 2015-01-26 DIAGNOSIS — Z23 Encounter for immunization: Secondary | ICD-10-CM

## 2015-01-26 NOTE — Progress Notes (Signed)
Patient ID: Female Samantha Livingston, female   DOB: 03-24-1956, 59 y.o.   MRN: [id number]    Facility  [facility]    Place of Service:   OFFICE    No Known Allergies  Chief Complaint  Patient presents with  . Medical Management of Chronic Issues    10 month follow-up    HPI:  Patient asked questions as to whether she should have genetic testing because her mother had breast cancer that is now metastatic to bone there is additional family history of breast cancer in her maternal grandfather's sister (great aunt). A maternal aunt had stomach cancer. Her maternal grandfather had throat cancer. Patient is unaware as to whether mother ever had testing for breast cancer with associated genetic mutations (BRCA).  Migraine headaches are still present but are much fewer and less intense than in the past. He continues to benefit from the use of Maxalt. She is now getting headaches about 3 times per month.  Last lipid panel was in November 2015. It showed an elevated LDL. She has not been retested since then. She is not fasting at the time of today's visit.  Medications: Patient's Medications  New Prescriptions   No medications on file  Previous Medications   LORAZEPAM (ATIVAN) 1 MG TABLET    TAKE 1 TABLET BY MOUTH UP TO THREE TIMES A DAY IF NEEDED FOR ANXIETY   MULTIPLE VITAMIN (MULTIVITAMIN) TABLET    Take 1 tablet by mouth daily.   RIZATRIPTAN (MAXALT) 10 MG TABLET    TAKE 1 TABLET AT ONSET OF MIGRAINE HEADACHES  Modified Medications   No medications on file  Discontinued Medications   PRAVASTATIN (PRAVACHOL) 20 MG TABLET    One daily to lower cholesterol   TRIAMCINOLONE CREAM (KENALOG) 0.1 %    Apply topically as needed.   Family History  Problem Relation Age of Onset  . Hypertension Brother   . Hypertension Brother   . Hypertension Brother   . Migraines Daughter   . Breast cancer Mother     2009  . Bone cancer Mother     05/2013  . Cancer Mother     Breast with metastatic disease to bone  .  Cancer Maternal Aunt     Stomach  . Cancer Maternal Grandfather     Throat  . Cancer Maternal Aunt     Cancer of the breast     Review of Systems  Constitutional: Positive for fatigue.  HENT: Negative.   Eyes: Negative.   Respiratory: Negative.   Cardiovascular: Negative.   Gastrointestinal: Negative.   Endocrine: Negative.   Genitourinary: Negative.   Musculoskeletal: Negative.   Skin:       Generalized itching. No rash or sores. Has seen Dr. Allyson Sabal.  Allergic/Immunologic: Negative.   Neurological:       History migraine headache. Currently controlled.  Hematological: Negative.   Psychiatric/Behavioral: Negative.     Filed Vitals:   01/26/15 1641  BP: 110/74  Pulse: 61  Temp: 98.1 F (36.7 C)  TempSrc: Oral  Resp: 14  Height: $Remove'5\' 6"'NVcavbD$  (1.676 m)  Weight: 136 lb (61.689 kg)  SpO2: 97%   Body mass index is 21.96 kg/(m^2).  Physical Exam  Constitutional: She is oriented to person, place, and time. She appears well-nourished. No distress.  HENT:  Head: Normocephalic and atraumatic.  Right Ear: External ear normal.  Left Ear: External ear normal.  Nose: Nose normal.  Mouth/Throat: Oropharynx is clear and moist.  Eyes: Conjunctivae and EOM are normal.  Pupils are equal, round, and reactive to light.  Neck: No JVD present. No tracheal deviation present. No thyromegaly present.  Cardiovascular: Normal rate, regular rhythm, normal heart sounds and intact distal pulses.  Exam reveals no gallop and no friction rub.   No murmur heard. Pulmonary/Chest: No respiratory distress. She has no wheezes. She has no rales.  Abdominal: She exhibits no distension and no mass. There is no tenderness.  Genitourinary:  Done by GYN  Musculoskeletal: Normal range of motion. She exhibits no edema or tenderness.  Lymphadenopathy:    She has no cervical adenopathy.  Neurological: She is alert and oriented to person, place, and time. No cranial nerve deficit. Coordination normal.  Skin: No  rash noted. No erythema. No pallor.  Psychiatric: She has a normal mood and affect. Her behavior is normal. Judgment and thought content normal.     Labs reviewed: Lab Summary Latest Ref Rng 03/27/2014 11/14/2013 02/26/2013  Hemoglobin 11.1 - 15.9 g/dL (None) 14.7 14.9  Hematocrit 34.0 - 46.6 % (None) 41.3 43.0  White count 3.4 - 10.8 x10E3/uL (None) 7.5 8.4  Platelet count 150 - 379 x10E3/uL (None) (None) 321  Sodium 134 - 144 mmol/L (None) 141 141  Potassium 3.5 - 5.2 mmol/L (None) 4.3 4.0  Calcium 8.7 - 10.2 mg/dL (None) 9.3 9.5  Phosphorus - (None) (None) (None)  Creatinine 0.57 - 1.00 mg/dL (None) 0.82 0.83  AST 0 - 40 IU/L (None) 16 15  Alk Phos 39 - 117 IU/L (None) 104 85  Bilirubin 0.0 - 1.2 mg/dL (None) 0.6 0.6  Glucose 65 - 99 mg/dL (None) 83 89  Cholesterol - (None) (None) (None)  HDL cholesterol >39 mg/dL 62 70 (None)  Triglycerides 0 - 149 mg/dL 160(H) 113 (None)  LDL Direct - (None) (None) (None)  LDL Calc 0 - 99 mg/dL 167(H) 176(H) (None)  Total protein - (None) (None) (None)  Albumin 3.5 - 5.5 g/dL (None) 4.7 4.8   Lab Results  Component Value Date   TSH 1.350 02/26/2013   Lab Results  Component Value Date   BUN 16 11/14/2013        Assessment/Plan  1. Family history of breast cancer in mother I initially ordered the BRCA genetic testing, but patient is not insured at the present time and did not want to do this test due to a cost of $700. She is to check with her mother as to whether or not genetic testing has been done. She will return next year when she has insurance and we will consider reordering genetic testing at that time.  2. Migraine without aura and without status migrainosus, not intractable Continue Maxalt  3. Hyperlipidemia Recommended lipid panel prior to next visit

## 2015-01-28 ENCOUNTER — Encounter: Payer: Self-pay | Admitting: Internal Medicine

## 2015-07-27 ENCOUNTER — Ambulatory Visit: Payer: Self-pay | Admitting: Internal Medicine

## 2015-08-27 ENCOUNTER — Other Ambulatory Visit: Payer: Self-pay | Admitting: Internal Medicine

## 2015-08-30 ENCOUNTER — Other Ambulatory Visit: Payer: Self-pay | Admitting: Obstetrics & Gynecology

## 2015-08-30 LAB — HM MAMMOGRAPHY: HM Mammogram: NORMAL (ref 0–4)

## 2015-08-30 LAB — HM PAP SMEAR: HM PAP: NORMAL

## 2015-08-31 LAB — CYTOLOGY - PAP

## 2015-09-15 ENCOUNTER — Ambulatory Visit: Payer: Self-pay | Admitting: Internal Medicine

## 2015-10-20 ENCOUNTER — Telehealth: Payer: Self-pay | Admitting: *Deleted

## 2015-10-20 NOTE — Telephone Encounter (Signed)
Patient daughter, Lenna Sciara called and stated that her mother fell at the [place] in [facility] and hit her head, face and neck pretty hard. They went to ER but left because of long wait time. Daughter called here and wanted to make an appointment with Dr. Nyoka Cowden for next week but I advised her to go ahead and go to an Urgent Care to be evaluated today due to trauma and patient complaining about her neck hurting.  Daughter agreed.

## 2015-10-21 NOTE — Telephone Encounter (Signed)
I agree with this advice

## 2016-01-21 ENCOUNTER — Other Ambulatory Visit: Payer: Self-pay | Admitting: Internal Medicine

## 2016-04-11 ENCOUNTER — Ambulatory Visit: Payer: Self-pay

## 2016-04-28 DIAGNOSIS — Z719 Counseling, unspecified: Secondary | ICD-10-CM | POA: Insufficient documentation

## 2016-09-06 ENCOUNTER — Encounter: Payer: Self-pay | Admitting: Internal Medicine

## 2016-09-06 ENCOUNTER — Ambulatory Visit (INDEPENDENT_AMBULATORY_CARE_PROVIDER_SITE_OTHER): Payer: Self-pay | Admitting: Internal Medicine

## 2016-09-06 DIAGNOSIS — G4700 Insomnia, unspecified: Secondary | ICD-10-CM | POA: Insufficient documentation

## 2016-09-06 DIAGNOSIS — F4321 Adjustment disorder with depressed mood: Secondary | ICD-10-CM | POA: Insufficient documentation

## 2016-09-06 DIAGNOSIS — F4320 Adjustment disorder, unspecified: Secondary | ICD-10-CM

## 2016-09-06 MED ORDER — LORAZEPAM 1 MG PO TABS: ORAL_TABLET | ORAL | 0 refills | Status: DC

## 2016-09-06 NOTE — Progress Notes (Signed)
Facility  [facility]    Place of Service:   OFFICE    Allergies  Allergen Reactions  . Sulfur Rash    Chief Complaint  Patient presents with  . Medical Management of Chronic Issues    last seen 01/26/2015, wants a refill on Ativan. Mother died 03-08-16, having trouble sleeping    HPI:  Mother died 03/07/2016. Having some trouble sleeping. No issues during the day. Says she has poured herself into her business of [employer].  Migraines have not been a problem lately.  Surgery on the jaw for pain by Dr. Reginia Naas of [facility] in [place].    Medications: Patient's Medications  New Prescriptions   No medications on file  Previous Medications   LORAZEPAM (ATIVAN) 1 MG TABLET    TAKE 1 TABLET BY MOUTH UP TO THREE TIMES A DAY IF NEEDED FOR ANXIETY   MULTIPLE VITAMIN (MULTIVITAMIN) TABLET    Take 1 tablet by mouth daily.   RIZATRIPTAN (MAXALT) 10 MG TABLET    TAKE 1 TABLET AT ONSET OF MIGRAINE HEADACHES  Modified Medications   No medications on file  Discontinued Medications   No medications on file    Review of Systems  Constitutional: Positive for fatigue.  HENT: Negative.   Eyes: Negative.   Respiratory: Negative.   Cardiovascular: Negative.   Gastrointestinal: Negative.   Endocrine: Negative.   Genitourinary: Negative.   Musculoskeletal: Negative.   Skin:       Generalized itching. No rash or sores. Has seen Dr. Allyson Sabal.  Allergic/Immunologic: Negative.   Neurological:       History migraine headache. Currently controlled.  Hematological: Negative.   Psychiatric/Behavioral: Negative.     Vitals:   09/06/16 1415  BP: 108/74  Pulse: 60  Temp: 97.8 F (36.6 C)  TempSrc: Oral  SpO2: 98%  Weight: 138 lb (62.6 kg)  Height: '5\' 6"'$  (1.676 m)   Body mass index is 22.27 kg/m. Wt Readings from Last 3 Encounters:  09/06/16 138 lb (62.6 kg)  01/26/15 136 lb (61.7 kg)  03/31/14 139 lb (63 kg)      Physical Exam    Constitutional: She is oriented to person, place, and time. She appears well-nourished. No distress.  HENT:  Head: Normocephalic and atraumatic.  Right Ear: External ear normal.  Left Ear: External ear normal.  Nose: Nose normal.  Mouth/Throat: Oropharynx is clear and moist.  Eyes: Conjunctivae and EOM are normal. Pupils are equal, round, and reactive to light.  Neck: No JVD present. No tracheal deviation present. No thyromegaly present.  Cardiovascular: Normal rate, regular rhythm, normal heart sounds and intact distal pulses.  Exam reveals no gallop and no friction rub.   No murmur heard. Pulmonary/Chest: No respiratory distress. She has no wheezes. She has no rales.  Abdominal: She exhibits no distension and no mass. There is no tenderness.  Genitourinary:  Genitourinary Comments: Done by GYN  Musculoskeletal: Normal range of motion. She exhibits no edema or tenderness.  Lymphadenopathy:    She has no cervical adenopathy.  Neurological: She is alert and oriented to person, place, and time. No cranial nerve deficit. Coordination normal.  Skin: No rash noted. No erythema. No pallor.  Psychiatric: She has a normal mood and affect. Her behavior is normal. Judgment and thought content normal.    Labs reviewed: Lab Summary Latest Ref Rng & Units 03/27/2014 11/14/2013 03-07-13  Hemoglobin 11.1 - 15.9 g/dL (None) 14.7 14.9  Hematocrit 34.0 -  46.6 % (None) 41.3 43.0  White count 3.4 - 10.8 x10E3/uL (None) 7.5 8.4  Platelet count 150 - 379 x10E3/uL (None) (None) 321  Sodium 134 - 144 mmol/L (None) 141 141  Potassium 3.5 - 5.2 mmol/L (None) 4.3 4.0  Calcium 8.7 - 10.2 mg/dL (None) 9.3 9.5  Phosphorus - (None) (None) (None)  Creatinine 0.57 - 1.00 mg/dL (None) 0.82 0.83  AST 0 - 40 IU/L (None) 16 15  Alk Phos 39 - 117 IU/L (None) 104 85  Bilirubin 0.0 - 1.2 mg/dL (None) 0.6 0.6  Glucose 65 - 99 mg/dL (None) 83 89  Cholesterol - (None) (None) (None)  HDL cholesterol >39 mg/dL 62 70  (None)  Triglycerides 0 - 149 mg/dL 160(H) 113 (None)  LDL Direct - (None) (None) (None)  LDL Calc 0 - 99 mg/dL 167(H) 176(H) (None)  Total protein - (None) (None) (None)  Albumin 3.5 - 5.5 g/dL (None) 4.7 4.8  Some recent data might be hidden   Lab Results  Component Value Date   TSH 1.350 02/26/2013   Lab Results  Component Value Date   BUN 16 11/14/2013   BUN 13 02/26/2013   No results found for: HGBA1C  Assessment/Plan  1. Grief - LORazepam (ATIVAN) 1 MG tablet; TAKE 1 TABLET BY MOUTH UP TO THREE TIMES A DAY IF NEEDED FOR ANXIETY OR SLEEP  Dispense: 30 tablet; Refill: 0  2. Insomnia, unspecified type - LORazepam (ATIVAN) 1 MG tablet; TAKE 1 TABLET BY MOUTH UP TO THREE TIMES A DAY IF NEEDED FOR ANXIETY OR SLEEP  Dispense: 30 tablet; Refill: 0

## 2016-10-03 ENCOUNTER — Other Ambulatory Visit: Payer: Self-pay | Admitting: *Deleted

## 2016-10-03 MED ORDER — RIZATRIPTAN BENZOATE 10 MG PO TABS: ORAL_TABLET | ORAL | 5 refills | Status: DC

## 2016-10-03 NOTE — Telephone Encounter (Signed)
Patient requested to be sent to pharmacy.  

## 2016-10-13 ENCOUNTER — Telehealth: Payer: Self-pay | Admitting: *Deleted

## 2016-10-13 NOTE — Telephone Encounter (Signed)
Patient called and stated that she had gotten a tick bite last Saturday and she still feels like the head of the tick is still there. Patient stated that there is a red ring around the site. I instructed patient to go to Urgent care to be evaluated. Patient agreed.

## 2016-10-23 ENCOUNTER — Encounter: Payer: Self-pay | Admitting: Nurse Practitioner

## 2016-10-23 ENCOUNTER — Ambulatory Visit (INDEPENDENT_AMBULATORY_CARE_PROVIDER_SITE_OTHER): Payer: Self-pay | Admitting: Nurse Practitioner

## 2016-10-23 VITALS — BP 118/76 | HR 60 | Temp 97.9°F | Resp 17 | Ht 66.0 in | Wt 135.8 lb

## 2016-10-23 DIAGNOSIS — K148 Other diseases of tongue: Secondary | ICD-10-CM

## 2016-10-23 NOTE — Patient Instructions (Signed)
ENT referral placed.

## 2016-10-23 NOTE — Progress Notes (Signed)
Careteam: Patient Care Team: Estill Dooms, MD as PCP - General (Internal Medicine)  Advanced Directive information Does Patient Have a Medical Advance Directive?: Yes, Type of Advance Directive: [place];Living will  Allergies  Allergen Reactions  . Sulfur Rash    Chief Complaint  Patient presents with  . Acute Visit    Pt is being seen due to sore throat with painful swallowing. Pt states it has been going on for about 6 months and has seen several specialists but no cause has been found. Pt states that it feels as if there is something constantly caught in throat.      HPI: Patient is a 61 y.o. female seen in the office today for sore throat. This has been going on for 6 months. There is something in the back of her throat. Off and on but getting worse. Has gone to ENT 2 years ago due to constant sore throat. Thought it may be due to mold exposure or allergies. Feelings never went away but contributed it to allergies. Feels a knot on her neck now and saw something this morning in the back left of her throat. Feels like something in there when she swallows.  Nonsmoker. No fever or chills.  Review of Systems:  Review of Systems  Constitutional: Negative for chills, fever, malaise/fatigue and weight loss.  HENT: Positive for sore throat.   Respiratory: Negative for cough.   Cardiovascular: Negative for chest pain.  Skin: Negative for rash.  Neurological: Negative for dizziness and weakness.   Past Medical History:  Diagnosis Date  . 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 Irritable bowel syndrome   . Migraine with aura, without mention of intractable migraine without mention of status migrainosus    Past Surgical History:  Procedure Laterality Date  . APPENDECTOMY  02-23-2006  . TONSILLECTOMY  1969  . TUBAL LIGATION  1983  . UPPER GASTROINTESTINAL ENDOSCOPY  1980   Social History:   reports that she has never smoked. She has never used smokeless tobacco.  She reports that she does not drink alcohol or use drugs.  Family History  Problem Relation Age of Onset  . Breast cancer Mother        2009  . Bone cancer Mother        05/2013  . Cancer Mother        Breast with metastatic disease to bone  . CVA Mother   . Cancer Maternal Aunt        Cancer of the breast  . Cancer Maternal Aunt        Stomach  . Cancer Maternal Grandfather        Throat    Medications: Patient's Medications  New Prescriptions   No medications on file  Previous Medications   LORAZEPAM (ATIVAN) 1 MG TABLET    TAKE 1 TABLET BY MOUTH UP TO THREE TIMES A DAY IF NEEDED FOR ANXIETY OR SLEEP   MULTIPLE VITAMIN (MULTIVITAMIN) TABLET    Take 1 tablet by mouth daily.   RIZATRIPTAN (MAXALT) 10 MG TABLET    Take one tablet by mouth at onset of migraine headaches. May repeat in 2 hours if needed  Modified Medications   No medications on file  Discontinued Medications   No medications on file     Physical Exam:  Vitals:   10/23/16 1143  BP: 118/76  Pulse: 60  Resp: 17  Temp: 97.9 F (36.6 C)  TempSrc: Oral  SpO2: 97%  Weight: 135 lb 12.8 oz (61.6 kg)  Height: 5\' 6"  (1.676 m)   Body mass index is 21.92 kg/m.  Physical Exam  Constitutional: She is oriented to person, place, and time. She appears well-developed and well-nourished. No distress.  HENT:  Head: Normocephalic and atraumatic.  Mouth/Throat: Oropharynx is clear and moist. No oropharyngeal exudate.    Eyes: Conjunctivae are normal. Pupils are equal, round, and reactive to light.  Neck: Normal range of motion. Neck supple.  Cardiovascular: Normal rate, regular rhythm and normal heart sounds.   Pulmonary/Chest: Effort normal and breath sounds normal.  Abdominal: Soft. Bowel sounds are normal.  Musculoskeletal: She exhibits no edema or tenderness.  Neurological: She is alert and oriented to person, place, and time.  Skin: Skin is warm and dry. She is not diaphoretic.  Psychiatric: She has a  normal mood and affect.    Labs reviewed: Basic Metabolic Panel: No results for input(s): NA, K, CL, CO2, GLUCOSE, BUN, CREATININE, CALCIUM, MG, PHOS, TSH in the last 8760 hours. Liver Function Tests: No results for input(s): AST, ALT, ALKPHOS, BILITOT, PROT, ALBUMIN in the last 8760 hours. No results for input(s): LIPASE, AMYLASE in the last 8760 hours. No results for input(s): AMMONIA in the last 8760 hours. CBC: No results for input(s): WBC, NEUTROABS, HGB, HCT, MCV, PLT in the last 8760 hours. Lipid Panel: No results for input(s): CHOL, HDL, LDLCALC, TRIG, CHOLHDL, LDLDIRECT in the last 8760 hours. TSH: No results for input(s): TSH in the last 8760 hours. A1C: No results found for: HGBA1C   Assessment/Plan 1. Tongue lesion - unable to visualize entire area due to mouth and being on the far aspect of the tongue.  -Ambulatory referral to ENT for further evaluation and treatment.   Carlos American. Harle Battiest  [facility] [phone] 8 am - 5 pm) [phone] (after hours)

## 2016-11-20 ENCOUNTER — Ambulatory Visit: Payer: Self-pay | Admitting: Pharmacotherapy

## 2017-11-07 ENCOUNTER — Other Ambulatory Visit: Payer: Self-pay | Admitting: Internal Medicine

## 2017-11-07 ENCOUNTER — Other Ambulatory Visit: Payer: Self-pay | Admitting: *Deleted

## 2017-11-07 MED ORDER — RIZATRIPTAN BENZOATE 10 MG PO TABS: ORAL_TABLET | ORAL | 5 refills | Status: AC

## 2017-11-07 NOTE — Telephone Encounter (Signed)
Patient requested refill

## 2019-04-30 ENCOUNTER — Other Ambulatory Visit: Payer: Self-pay | Admitting: Obstetrics & Gynecology

## 2019-04-30 DIAGNOSIS — R928 Other abnormal and inconclusive findings on diagnostic imaging of breast: Secondary | ICD-10-CM

## 2019-05-05 ENCOUNTER — Ambulatory Visit
Admission: RE | Admit: 2019-05-05 | Discharge: 2019-05-05 | Disposition: A | Discharge status: Home / Self Care | Payer: BC Managed Care – PPO | Source: Ambulatory Visit | Attending: Obstetrics & Gynecology | Admitting: Obstetrics & Gynecology

## 2019-05-05 ENCOUNTER — Other Ambulatory Visit: Payer: Self-pay

## 2019-05-05 ENCOUNTER — Other Ambulatory Visit: Payer: Self-pay | Admitting: Obstetrics & Gynecology

## 2019-05-05 DIAGNOSIS — R928 Other abnormal and inconclusive findings on diagnostic imaging of breast: Secondary | ICD-10-CM

## 2019-05-09 ENCOUNTER — Ambulatory Visit
Admission: RE | Admit: 2019-05-09 | Discharge: 2019-05-09 | Disposition: A | Discharge status: Home / Self Care | Payer: BC Managed Care – PPO | Source: Ambulatory Visit | Attending: Obstetrics & Gynecology | Admitting: Obstetrics & Gynecology

## 2019-05-09 ENCOUNTER — Other Ambulatory Visit: Payer: Self-pay

## 2019-05-09 ENCOUNTER — Other Ambulatory Visit: Payer: Self-pay | Admitting: Obstetrics & Gynecology

## 2019-05-09 DIAGNOSIS — R928 Other abnormal and inconclusive findings on diagnostic imaging of breast: Secondary | ICD-10-CM

## 2019-05-13 DIAGNOSIS — D0510 Intraductal carcinoma in situ of unspecified breast: Secondary | ICD-10-CM | POA: Insufficient documentation

## 2019-05-28 ENCOUNTER — Encounter: Payer: Self-pay | Admitting: Adult Health

## 2019-05-28 DIAGNOSIS — C50411 Malignant neoplasm of upper-outer quadrant of right female breast: Secondary | ICD-10-CM | POA: Insufficient documentation

## 2019-05-29 ENCOUNTER — Telehealth: Payer: BC Managed Care – PPO

## 2019-06-04 ENCOUNTER — Telehealth: Payer: Self-pay | Admitting: Genetic Counselor

## 2019-06-04 NOTE — Telephone Encounter (Signed)
An urgent genetic counseling appt has been scheduled for Samantha Livingston to be seen on 1/13 at 8am. I verified Ms. Klipp has an active mychart account along with her email address.

## 2019-06-05 ENCOUNTER — Encounter: Payer: Self-pay | Admitting: Genetic Counselor

## 2019-06-05 ENCOUNTER — Ambulatory Visit (HOSPITAL_BASED_OUTPATIENT_CLINIC_OR_DEPARTMENT_OTHER): Payer: BC Managed Care – PPO | Admitting: Genetic Counselor

## 2019-06-05 ENCOUNTER — Inpatient Hospital Stay: Payer: BC Managed Care – PPO | Attending: Genetic Counselor

## 2019-06-05 ENCOUNTER — Other Ambulatory Visit: Payer: Self-pay

## 2019-06-05 ENCOUNTER — Other Ambulatory Visit: Payer: Self-pay | Admitting: Genetic Counselor

## 2019-06-05 DIAGNOSIS — Z808 Family history of malignant neoplasm of other organs or systems: Secondary | ICD-10-CM

## 2019-06-05 DIAGNOSIS — C50411 Malignant neoplasm of upper-outer quadrant of right female breast: Secondary | ICD-10-CM

## 2019-06-05 DIAGNOSIS — Z171 Estrogen receptor negative status [ER-]: Secondary | ICD-10-CM

## 2019-06-05 DIAGNOSIS — Z800 Family history of malignant neoplasm of digestive organs: Secondary | ICD-10-CM | POA: Insufficient documentation

## 2019-06-05 DIAGNOSIS — Z803 Family history of malignant neoplasm of breast: Secondary | ICD-10-CM | POA: Insufficient documentation

## 2019-06-05 LAB — GENETIC SCREENING ORDER

## 2019-06-05 NOTE — Progress Notes (Signed)
REFERRING PROVIDER: [name], MD [address] [place],  [place] [address]  PRIMARY PROVIDER:  No primary care provider on file.  PRIMARY REASON FOR VISIT:  1. Malignant neoplasm of upper-outer quadrant of right breast in female, estrogen receptor negative ([place])   2. Family history of breast cancer   3. Family history of stomach cancer   4. Family history of melanoma      HISTORY OF PRESENT ILLNESS:  I connected with  Samantha Livingston on 06/05/2019 at 8:00 A< EDT by MyChart video conference and verified that I am speaking with the correct person using two identifiers.   Patient location: Home Provider location: [facility]   Samantha Livingston, a 64 y.o. female, was seen for a [facility] cancer genetics consultation at the request of Dr. Donne Hazel due to a personal and family history of cancer.  Samantha Livingston presents to clinic today to discuss the possibility of a hereditary predisposition to cancer, genetic testing, and to further clarify her future cancer risks, as well as potential cancer risks for family members.   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 The treatment plan lumpectomy and radiation.    CANCER HISTORY:  Oncology History  Malignant neoplasm of upper-outer quadrant of right breast in female, estrogen receptor negative ([place])  05/28/2019 Initial Diagnosis   Malignant neoplasm of upper-outer quadrant of right breast in female, estrogen receptor negative ([place])   05/28/2019 Cancer Staging   Staging form: Breast, AJCC 8th Edition - Clinical: Stage 0 (cTis (DCIS), cN0, cM0, ER-, PR-) - Signed by Gardenia Phlegm, NP on 05/28/2019      RISK FACTORS:  Menarche was at age 59.  First live birth at age 61.  OCP use for approximately 0 years.  Ovaries intact: yes.  Hysterectomy: no.  Menopausal status: postmenopausal.  HRT use: 0 years. Colonoscopy: no; not  examined. Mammogram within the last year: yes. Number of breast biopsies: 1. Up to date with pelvic exams: yes. Any excessive radiation exposure in the past: no  Past Medical History:  Diagnosis Date  . 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 Family history of breast cancer   . Family history of melanoma   . Family history of stomach cancer   . Irritable bowel syndrome   . Migraine with aura, without mention of intractable migraine without mention of status migrainosus     Past Surgical History:  Procedure Laterality Date  . APPENDECTOMY  02-23-2006  . TONSILLECTOMY  1969  . TUBAL LIGATION  1983  . UPPER GASTROINTESTINAL ENDOSCOPY  1980    Social History   Socioeconomic History  . Marital status: Unknown    Spouse name: Not on file  . Number of children: Not on file  . Years of education: Not on file  . Highest education level: Not on file  Occupational History  . Not on file  Tobacco Use  . Smoking status: Never Smoker  . Smokeless tobacco: Never Used  Substance and Sexual Activity  . Alcohol use: No  . Drug use: No  . Sexual activity: Not on file  Other Topics Concern  . Not on file  Social History Narrative   Husband died 56 after a prolonged illness. Married 20 years. Her 2nd marriage and his 54th.   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   Social  Determinants of Health   Financial Resource Strain:   . Difficulty of Paying Living Expenses: Not on file  Food Insecurity:   . Worried About Charity fundraiser in the Last Year: Not on file  . Ran Out of Food in the Last Year: Not on file  Transportation Needs:   . Lack of Transportation (Medical): Not on file  . Lack of Transportation (Non-Medical): Not on file  Physical Activity:   . Days of Exercise per Week: Not on file  . Minutes of Exercise per Session: Not on file  Stress:   . Feeling of Stress : Not on file  Social Connections:   . Frequency of Communication with Friends and Family:  Not on file  . Frequency of Social Gatherings with Friends and Family: Not on file  . Attends Religious Services: Not on file  . Active Member of Clubs or Organizations: Not on file  . Attends Archivist Meetings: Not on file  . Marital Status: Not on file     FAMILY HISTORY:  We obtained a detailed, 4-generation family history.  Significant diagnoses are listed below: Family History  Problem Relation Age of Onset  . Breast cancer Mother        2009  . Bone cancer Mother        05/2013  . Cancer Mother        Breast with metastatic disease to bone  . CVA Mother   . Melanoma Brother 27  . Stomach cancer Maternal Aunt 66  . Cancer Maternal Grandfather        Throat  . Breast cancer Other 85       MGF's sister    The patient has two daughters who are cancer free.  She has two full brothers and a maternal half brother and sister.  One full brother was diagnosed with melanoma in his early 77's.  Her mother is deceased and her father is living.  The patient's mother was diagnosed breast cancer at 69.  She had one sister who had stomach cancer at 75.  The maternal grandfather died of throat cancer and was a smoker.  He had a sister who had breast cancer.  The patient's father is living.  He has two brothers and a sister who are reportedly cancer free. There is no reported cancer on the paternal side of the family.  [name] is unaware of previous family history of genetic testing for hereditary cancer risks. Patient's maternal ancestors are of Caucasian descent, and paternal ancestors are of Caucasian descent. There is no reported Ashkenazi Jewish ancestry. There is no known consanguinity.  GENETIC COUNSELING ASSESSMENT: [name] is a 64 y.o. female with a personal and family history of cancer which is somewhat suggestive of a hereditary cancer syndrome and predisposition to cancer given the number of breast cancer cases in the family and young age of onset of stomach  cancer in the family. We, therefore, discussed and recommended the following at today's visit.   DISCUSSION: We discussed that 5 - 10% of breast cancer is hereditary, with most cases associated with BRCA mutations.  There are other genes that can be associated with hereditary breast cancer syndromes.  These include ATM, CHEK2 and PALB2.  Based on the young stomach cancer in her maternal aunt we may also want to consider CDH1 gene mutations.  We discussed that testing is beneficial for several reasons including knowing how to follow individuals after completing their treatment, identifying whether  potential treatment options such as PARP inhibitors would be beneficial, and understand if other family members could be at risk for cancer and allow them to undergo genetic testing.   We reviewed the characteristics, features and inheritance patterns of hereditary cancer syndromes. We also discussed genetic testing, including the appropriate family members to test, the process of testing, insurance coverage and turn-around-time for results. We discussed the implications of a negative, positive and/or variant of uncertain significant result. In order to get genetic test results in a timely manner so that Ms. Wellnitz can use these genetic test results for surgical decisions, we recommended Ms. Rosello pursue genetic testing for the 9-gene STAT panel. Once complete, we recommend Ms. Schroeck pursue reflex genetic testing to the common hereditary cancer gene panel. The Common Hereditary Gene Panel offered by [facility] includes sequencing and/or deletion duplication testing of the following 48 genes: APC, ATM, AXIN2, BARD1, BMPR1A, BRCA1, BRCA2, BRIP1, CDH1, CDK4, CDKN2A (p14ARF), CDKN2A (p16INK4a), CHEK2, CTNNA1, DICER1, EPCAM (Deletion/duplication testing only), GREM1 (promoter region deletion/duplication testing only), KIT, MEN1, MLH1, MSH2, MSH3, MSH6, MUTYH, NBN, NF1, NHTL1, PALB2, PDGFRA, PMS2, POLD1, POLE, PTEN, RAD50,  RAD51C, RAD51D, RNF43, SDHB, SDHC, SDHD, SMAD4, SMARCA4. STK11, TP53, TSC1, TSC2, and VHL.  The following genes were evaluated for sequence changes only: SDHA and HOXB13 c.251G>A variant only.   Based on Ms. Reger's personal and family history of cancer, she meets medical criteria for genetic testing. Despite that she meets criteria, she may still have an out of pocket cost.   PLAN: After considering the risks, benefits, and limitations, Ms. Loncar provided informed consent to pursue genetic testing and the blood sample was sent to [facility] for analysis of the common hereditary cancer panel. Results should be available within approximately 5-12 days for the STAT panel and up to an additional 2-3 weeks' for the remainder of the panel at which point they will be disclosed by telephone to Ms. Mikula, as will any additional recommendations warranted by these results. Ms. Parada will receive a summary of her genetic counseling visit and a copy of her results once available. This information will also be available in Epic.   Lastly, we encouraged Ms. Weng to remain in contact with cancer genetics annually so that we can continuously update the family history and inform her of any changes in cancer genetics and testing that may be of benefit for this family.   Ms. Atkerson questions were answered to her satisfaction today. Our contact information was provided should additional questions or concerns arise. Thank you for the referral and allowing [place] to share in the care of your patient.   Lissett Favorite P. Florene Glen, [place], [facility] Licensed, Insurance risk surveyor [email or web address] phone: [phone]  The patient was seen for a total of 32 minutes in face-to-face genetic counseling.  This patient was discussed with Drs. Magrinat, Lindi Adie and/or Burr Medico who agrees with the above.    _______________________________________________________________________ For Office Staff:  Number of people  involved in session: 1 Was an Intern/ student involved with case: no

## 2019-06-06 DIAGNOSIS — D0511 Intraductal carcinoma in situ of right breast: Secondary | ICD-10-CM

## 2019-06-11 ENCOUNTER — Other Ambulatory Visit: Payer: Self-pay | Admitting: General Surgery

## 2019-06-11 DIAGNOSIS — D0511 Intraductal carcinoma in situ of right breast: Secondary | ICD-10-CM

## 2019-06-12 ENCOUNTER — Other Ambulatory Visit: Payer: Self-pay | Admitting: General Surgery

## 2019-06-12 DIAGNOSIS — D0511 Intraductal carcinoma in situ of right breast: Secondary | ICD-10-CM

## 2019-06-16 NOTE — Progress Notes (Signed)
Location of Breast Cancer: Malignant neoplasm of UOQ of right breast in female, ER - -  Did patient present with symptoms (if so, please note symptoms) or was this found on screening mammography?:  Screening mammogram showed c density breast, some stable right breast calcs, 1.5 cm of new calcifications in the uoq right breast.  Cancer Staging 05/28/2019: Staging form: Breast, AJCC 8th Edition - Clinical: Stage 0 (cTis (DCIS), cN0, cM0, ER-, PR-) - Signed by Gardenia Phlegm, NP on 05/28/2019  Histology per Pathology Report: Right Breast 05/09/2019  Receptor Status: ER(- 0%), PR (- 0%), Her2-neu (), Ki-()    Past/Anticipated interventions by surgeon, if any: Dr. Donne Hazel 06/02/2019 - Right breast seed guided lumpectomy, genetics, refer to Dr. Drema Halon and rad onc TBD. -She does not need a sentinel node biopsy for this. -We discussed she is not a candidate form the COMET trial. -She doesn't technically meet genetics criteria from NCCN but ASBS would recommend and due to having two daughters and mom with bca she would like to pursue. -We discussed the performance of the lumpectomy with radioactive seed placement. -Right breast lumpectomy with radioactive seed localization scheduled for 07/08/2019.  Past/Anticipated interventions by medical oncology, if any: Chemotherapy  Dr. Hinton Rao   Lymphedema issues, if any:  No  Pain issues, if any:  Has some tenderness at the biopsy site. Spoke with Dr. Donne Hazel and he states it feels like a hematoma.  SAFETY ISSUES:  Prior radiation? No  Pacemaker/ICD? No  Possible current pregnancy? Postmenopausal  Is the patient on methotrexate? No  Current Complaints / other details:      Cori Razor, RN 06/16/2019,11:00 AM

## 2019-06-17 ENCOUNTER — Ambulatory Visit
Admission: RE | Admit: 2019-06-17 | Discharge: 2019-06-17 | Disposition: A | Discharge status: Home / Self Care | Payer: BC Managed Care – PPO | Source: Ambulatory Visit | Attending: Radiation Oncology | Admitting: Radiation Oncology

## 2019-06-17 ENCOUNTER — Other Ambulatory Visit: Payer: Self-pay

## 2019-06-17 ENCOUNTER — Encounter: Payer: Self-pay | Admitting: Radiation Oncology

## 2019-06-17 VITALS — Ht 65.0 in | Wt 142.0 lb

## 2019-06-17 DIAGNOSIS — C50411 Malignant neoplasm of upper-outer quadrant of right female breast: Secondary | ICD-10-CM

## 2019-06-17 NOTE — Progress Notes (Signed)
Radiation Oncology         [phone] ________________________________  Name: Marchella Hibbard        MRN: [id number]  Date of Service: 06/17/2019 DOB: 01-23-1956  ID:[id number], Pcp Not In  [name], MD     REFERRING PHYSICIAN: [name], MD   DIAGNOSIS: The encounter diagnosis was Malignant neoplasm of upper-outer quadrant of right breast in female, estrogen receptor negative ([place]).   HISTORY OF PRESENT ILLNESS: [place] Mielke is a 64 y.o. female  with a new diagnosis of right breast cancer. The patient was noted to have calcifications in the right breast several years ago and has been undergoing diagnostic imaging of the breast.  On 05/05/2019 diagnostic images revealed 15 mm group of calcifications in the right breast.  She also had stability of punctate calcifications also in the upper right breast but been followed and stable for approximately 3 years time.  She underwent a stereotactic biopsy on 05/09/2019 which revealed high-grade DCIS with calcifications, her ER and PR receptors were negative.  She has met with Dr. Donne Hazel and has plans to undergo lumpectomy on 06/28/2019.  She is seen today via Mychart to discuss options of adjuvant radiotherapy. She has also met with Dr. Hinton Rao and reports her mother was cared by Dr. Hinton Rao.   PREVIOUS RADIATION THERAPY: No   PAST MEDICAL HISTORY:  Past Medical History:  Diagnosis Date  . 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 Family history of breast cancer   . Family history of melanoma   . Family history of stomach cancer   . Irritable bowel syndrome   . Migraine with aura, without mention of intractable migraine without mention of status migrainosus        PAST SURGICAL HISTORY: Past Surgical History:  Procedure Laterality Date  . APPENDECTOMY  02-23-2006  . TONSILLECTOMY  1969  . TUBAL LIGATION  1983  . UPPER GASTROINTESTINAL ENDOSCOPY  1980     FAMILY HISTORY:  Family History  Problem Relation Age of Onset  .  Breast cancer Mother        2009  . Bone cancer Mother        05/2013  . Cancer Mother        Breast with metastatic disease to bone  . CVA Mother   . Melanoma Brother 79  . Stomach cancer Maternal Aunt 62  . Cancer Maternal Grandfather        Throat  . Breast cancer Other 18       MGF's sister     SOCIAL HISTORY:  reports that she has never smoked. She has never used smokeless tobacco. She reports that she does not drink alcohol or use drugs.  The patient is single and lives in [place].  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 Her daughter lives nearby.    ALLERGIES: Sulfur   MEDICATIONS:  Current Outpatient Medications  Medication Sig Dispense Refill  . LORazepam (ATIVAN) 1 MG tablet TAKE 1 TABLET BY MOUTH UP TO THREE TIMES A DAY IF NEEDED FOR ANXIETY OR SLEEP 30 tablet 0  . Multiple Vitamin (MULTIVITAMIN) tablet Take 1 tablet by mouth daily.    . rizatriptan (MAXALT) 10 MG tablet Take one tablet by mouth at onset of migraine headaches. May repeat in 2 hours if needed 8 tablet 5   No current facility-administered medications for this encounter.     REVIEW OF SYSTEMS: On review of systems, the patient reports that  she is doing well overall. No significant complaints were verbalized.     PHYSICAL EXAM:  Wt Readings from Last 3 Encounters:  10/23/16 135 lb 12.8 oz (61.6 kg)  09/06/16 138 lb (62.6 kg)  01/26/15 136 lb (61.7 kg)   Temp Readings from Last 3 Encounters:  10/23/16 97.9 F (36.6 C) (Oral)  09/06/16 97.8 F (36.6 C) (Oral)  01/26/15 98.1 F (36.7 C) (Oral)   BP Readings from Last 3 Encounters:  10/23/16 118/76  09/06/16 108/74  01/26/15 110/74   Pulse Readings from Last 3 Encounters:  10/23/16 60  09/06/16 60  01/26/15 61    In general this is a well appearing Caucasian female in no acute distress.  She's alert and oriented x4 and appropriate throughout the examination. Cardiopulmonary assessment is negative for  acute distress and she exhibits normal effort. Bilateral breast exam is deferred.   ECOG = 0  0 - Asymptomatic (Fully active, able to carry on all predisease activities without restriction)  1 - Symptomatic but completely ambulatory (Restricted in physically strenuous activity but ambulatory and able to carry out work of a light or sedentary nature. For example, light housework, office work)  2 - Symptomatic, <50% in bed during the day (Ambulatory and capable of all self care but unable to carry out any work activities. Up and about more than 50% of waking hours)  3 - Symptomatic, >50% in bed, but not bedbound (Capable of only limited self-care, confined to bed or chair 50% or more of waking hours)  4 - Bedbound (Completely disabled. Cannot carry on any self-care. Totally confined to bed or chair)  5 - Death   Eustace Pen MM, Creech RH, Tormey DC, et al. [phone]). "Toxicity and response criteria of the [facility]". [place] Oncol. 5 (6): 649-55    LABORATORY DATA:  Lab Results  Component Value Date   WBC 7.5 11/14/2013   HGB 14.7 11/14/2013   HCT 41.3 11/14/2013   MCV 85 11/14/2013   PLT 321 02/26/2013   Lab Results  Component Value Date   NA 141 11/14/2013   K 4.3 11/14/2013   CL 101 11/14/2013   CO2 26 11/14/2013   Lab Results  Component Value Date   ALT 17 11/14/2013   AST 16 11/14/2013   ALKPHOS 104 11/14/2013   BILITOT 0.6 11/14/2013      RADIOGRAPHY: No results found.     IMPRESSION/PLAN: 1. 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 Dr. Lisbeth Renshaw discusses the pathology findings and reviews the nature of noninvasive breast disease. The consensus from the breast conference includes breast conservation with lumpectomy.  She would benefit from adjuvant radiotherapy to reduce the risks of local recurrence.  She will continue with Dr. Hinton Rao in medical oncology as well.  We discussed the risks, benefits, short, and long term effects of  radiotherapy, and the patient is interested in proceeding. Dr. Lisbeth Renshaw discusses the delivery and logistics of radiotherapy and anticipates a course of 4 weeks of curative radiotherapy to the right breast. We will see her back about 2 weeks after surgery to discuss the simulation process and anticipate we starting radiotherapy about 4-6 weeks after surgery.   This encounter was provided by telemedicine platform MyChart.  The patient has given verbal consent for this type of encounter and has been advised to only accept a meeting of this type in a secure network environment. The time spent during this encounter and in preparation was 45 minutes. The  attendants for this meeting include Blenda Nicely, RN, Dr. Lisbeth Renshaw, Hayden Pedro  and Fransico Setters.  During the encounter,  Blenda Nicely, RN, Dr. Lisbeth Renshaw, and Hayden Pedro were located at [facility] Radiation Oncology Department.  Sahvannah Rieser was located at home.    The above documentation reflects my direct findings during this shared patient visit. Please see the separate note by Dr. Lisbeth Renshaw on this date for the remainder of the patient's plan of care.    Carola Rhine, PAC

## 2019-06-18 ENCOUNTER — Encounter: Payer: Self-pay | Admitting: *Deleted

## 2019-06-18 NOTE — Progress Notes (Signed)
[place] Psychosocial Distress Screening Clinical Social Work  Clinical Social Work was referred by distress screening protocol.  The patient scored a 8 on the Psychosocial Distress Thermometer which indicates moderate distress. Clinical Social Worker contacted patient at home to offer support, and assess for distress and other psychosocial needs.  Patient stated she felt "much better" after talking with the radiation oncology team and getting more information on her treatment plan.  CSW and patient discussed common feelings and emotions when being diagnosed with cancer and the importance of support.  CSW provided education on the support team and support services at [facility].  Patient was agreeable to being added to the [facility] support calendar mailing list.  CSW encouraged patient to call with additional questions or concerns.    ONCBCN DISTRESS SCREENING 06/17/2019  Screening Type Initial Screening  Distress experienced in past week (1-10) 8  Emotional problem type Nervousness/Anxiety;Adjusting to illness  Other Contact via telephone.    Johnnye Lana, MSW, LCSW, OSW-C Clinical Social Worker [facility], [place] [phone]

## 2019-06-19 ENCOUNTER — Ambulatory Visit: Payer: Self-pay | Admitting: Genetic Counselor

## 2019-06-19 ENCOUNTER — Telehealth: Payer: Self-pay | Admitting: Genetic Counselor

## 2019-06-19 ENCOUNTER — Encounter: Payer: Self-pay | Admitting: Genetic Counselor

## 2019-06-19 DIAGNOSIS — Z1379 Encounter for other screening for genetic and chromosomal anomalies: Secondary | ICD-10-CM

## 2019-06-19 NOTE — Telephone Encounter (Signed)
Revealed negative genetic testing.  Discussed that we do not know why she has breast cancer or why there is cancer in the family. It could be due to a different gene that we are not testing, or maybe our current technology may not be able to pick something up.  It will be important for her to keep in contact with genetics to keep up with whether additional testing may be needed. 

## 2019-06-19 NOTE — Telephone Encounter (Signed)
LM on VM that results are back and to please call.  Left CB number. 

## 2019-06-19 NOTE — Progress Notes (Addendum)
HPI:  Ms. Garmany was previously seen in the [place] clinic due to a personal and family history of breast cancer and concerns regarding a hereditary predisposition to cancer. Please refer to our prior cancer genetics clinic note for more information regarding our discussion, assessment and recommendations, at the time. Ms. Helin recent genetic test results were disclosed to her, as were recommendations warranted by these results. These results and recommendations are discussed in more detail below.  CANCER HISTORY:  Oncology History  Malignant neoplasm of upper-outer quadrant of right breast in female, estrogen receptor negative ([place])  05/28/2019 Initial Diagnosis   Malignant neoplasm of upper-outer quadrant of right breast in female, estrogen receptor negative ([place])   05/28/2019 Cancer Staging   Staging form: Breast, AJCC 8th Edition - Clinical: Stage 0 (cTis (DCIS), cN0, cM0, ER-, PR-) - Signed by Gardenia Phlegm, NP on 05/28/2019   06/18/2019 Genetic Testing   Negative genetic testing on the STAT and Common hereditary cancer panel.  The Common Hereditary Gene Panel offered by [facility] includes sequencing and/or deletion duplication testing of the following 48 genes: APC, ATM, AXIN2, BARD1, BMPR1A, BRCA1, BRCA2, BRIP1, CDH1, CDK4, CDKN2A (p14ARF), CDKN2A (p16INK4a), CHEK2, CTNNA1, DICER1, EPCAM (Deletion/duplication testing only), GREM1 (promoter region deletion/duplication testing only), KIT, MEN1, MLH1, MSH2, MSH3, MSH6, MUTYH, NBN, NF1, NHTL1, PALB2, PDGFRA, PMS2, POLD1, POLE, PTEN, RAD50, RAD51C, RAD51D, RNF43, SDHB, SDHC, SDHD, SMAD4, SMARCA4. STK11, TP53, TSC1, TSC2, and VHL.  The following genes were evaluated for sequence changes only: SDHA and HOXB13 c.251G>A variant only. The report date is June 18, 2019.     FAMILY HISTORY:  We obtained a detailed, 4-generation family history.  Significant diagnoses are listed below: Family History  Problem Relation Age  of Onset  . Breast cancer Mother        2009  . Bone cancer Mother        05/2013  . Cancer Mother        Breast with metastatic disease to bone  . CVA Mother   . Melanoma Brother 14  . Stomach cancer Maternal Aunt 75  . Cancer Maternal Grandfather        Throat  . Breast cancer Other 16       MGF's sister    The patient has two daughters who are cancer free.  She has two full brothers and a maternal half brother and sister.  One full brother was diagnosed with melanoma in his early 100's.  Her mother is deceased and her father is living.  The patient's mother was diagnosed breast cancer at 36.  She had one sister who had stomach cancer at 22.  The maternal grandfather died of throat cancer and was a smoker.  He had a sister who had breast cancer.  The patient's father is living.  He has two brothers and a sister who are reportedly cancer free. There is no reported cancer on the paternal side of the family.  Ms. Raczkowski is unaware of previous family history of genetic testing for hereditary cancer risks. Patient's maternal ancestors are of Caucasian descent, and paternal ancestors are of Caucasian descent. There is no reported Ashkenazi Jewish ancestry. There is no known consanguinity.    GENETIC TEST RESULTS: Genetic testing reported out on June 18, 2019 through the STAT and common hereditary cancer panel found no pathogenic mutations. The Common Hereditary Gene Panel offered by [facility] includes sequencing and/or deletion duplication testing of the following 48 genes: APC, ATM, AXIN2, BARD1, BMPR1A,  BRCA1, BRCA2, BRIP1, CDH1, CDK4, CDKN2A (p14ARF), CDKN2A (p16INK4a), CHEK2, CTNNA1, DICER1, EPCAM (Deletion/duplication testing only), GREM1 (promoter region deletion/duplication testing only), KIT, MEN1, MLH1, MSH2, MSH3, MSH6, MUTYH, NBN, NF1, NHTL1, PALB2, PDGFRA, PMS2, POLD1, POLE, PTEN, RAD50, RAD51C, RAD51D, RNF43, SDHB, SDHC, SDHD, SMAD4, SMARCA4. STK11, TP53, TSC1, TSC2, and VHL.   The following genes were evaluated for sequence changes only: SDHA and HOXB13 c.251G>A variant only. The test report has been scanned into EPIC and is located under the Molecular Pathology section of the Results Review tab.  A portion of the result report is included below for reference.     We discussed with Ms. Tavares that because current genetic testing is not perfect, it is possible there may be a gene mutation in one of these genes that current testing cannot detect, but that chance is small.  We also discussed, that there could be another gene that has not yet been discovered, or that we have not yet tested, that is responsible for the cancer diagnoses in the family. It is also possible there is a hereditary cause for the cancer in the family that Ms. Smola did not inherit and therefore was not identified in her testing.  Therefore, it is important to remain in touch with cancer genetics in the future so that we can continue to offer Ms. Jenning the most up to date genetic testing.   ADDITIONAL GENETIC TESTING: We discussed with Ms. Eccleston that there are other genes that are associated with increased cancer risk that can be analyzed. Should Ms. Fodor wish to pursue additional genetic testing, we are happy to discuss and coordinate this testing, at any time.    CANCER SCREENING RECOMMENDATIONS: Ms. Galer test result is considered negative (normal).  This means that we have not identified a hereditary cause for her personal and family history of breast cancer at this time. Most cancers happen by chance and this negative test suggests that her cancer may fall into this category.    While reassuring, this does not definitively rule out a hereditary predisposition to cancer. It is still possible that there could be genetic mutations that are undetectable by current technology. There could be genetic mutations in genes that have not been tested or identified to increase cancer risk.  Therefore,  it is recommended she continue to follow the cancer management and screening guidelines provided by her oncology and primary healthcare provider.   An individual's cancer risk and medical management are not determined by genetic test results alone. Overall cancer risk assessment incorporates additional factors, including personal medical history, family history, and any available genetic information that may result in a personalized plan for cancer prevention and surveillance  RECOMMENDATIONS FOR FAMILY MEMBERS:  Individuals in this family might be at some increased risk of developing cancer, over the general population risk, simply due to the family history of cancer.  We recommended women in this family have a yearly mammogram beginning at age 54, or 105 years younger than the earliest onset of cancer, an annual clinical breast exam, and perform monthly breast self-exams. Women in this family should also have a gynecological exam as recommended by their primary provider. All family members should have a colonoscopy by age 28.  FOLLOW-UP: Lastly, we discussed with Ms. Velasquez that cancer genetics is a rapidly advancing field and it is possible that new genetic tests will be appropriate for her and/or her family members in the future. We encouraged her to remain in contact with cancer genetics on  an annual basis so we can update her personal and family histories and let her know of advances in cancer genetics that may benefit this family.   Our contact number was provided. Ms. Peltz questions were answered to her satisfaction, and she knows she is welcome to call us at anytime with additional questions or concerns.   Roma Kayser, [place], [facility] Licensed, Certified Genetic Counselor [email or web address]

## 2019-07-01 ENCOUNTER — Other Ambulatory Visit: Payer: Self-pay

## 2019-07-01 ENCOUNTER — Encounter (HOSPITAL_BASED_OUTPATIENT_CLINIC_OR_DEPARTMENT_OTHER): Payer: Self-pay | Admitting: General Surgery

## 2019-07-02 NOTE — Progress Notes (Signed)

## 2019-07-04 ENCOUNTER — Other Ambulatory Visit (HOSPITAL_COMMUNITY)
Admission: RE | Admit: 2019-07-04 | Discharge: 2019-07-04 | Disposition: A | Discharge status: Home / Self Care | Payer: BC Managed Care – PPO | Source: Ambulatory Visit | Attending: General Surgery | Admitting: General Surgery

## 2019-07-04 DIAGNOSIS — Z20822 Contact with and (suspected) exposure to covid-19: Secondary | ICD-10-CM | POA: Insufficient documentation

## 2019-07-04 DIAGNOSIS — Z01812 Encounter for preprocedural laboratory examination: Secondary | ICD-10-CM | POA: Insufficient documentation

## 2019-07-04 LAB — SARS CORONAVIRUS 2 (TAT 6-24 HRS): SARS Coronavirus 2: NEGATIVE

## 2019-07-07 ENCOUNTER — Ambulatory Visit
Admission: RE | Admit: 2019-07-07 | Discharge: 2019-07-07 | Disposition: A | Discharge status: Home / Self Care | Payer: BC Managed Care – PPO | Source: Ambulatory Visit | Attending: General Surgery | Admitting: General Surgery

## 2019-07-07 ENCOUNTER — Other Ambulatory Visit: Payer: Self-pay

## 2019-07-07 DIAGNOSIS — D0511 Intraductal carcinoma in situ of right breast: Secondary | ICD-10-CM

## 2019-07-08 ENCOUNTER — Ambulatory Visit (HOSPITAL_BASED_OUTPATIENT_CLINIC_OR_DEPARTMENT_OTHER): Payer: BC Managed Care – PPO | Admitting: Anesthesiology

## 2019-07-08 ENCOUNTER — Ambulatory Visit (HOSPITAL_BASED_OUTPATIENT_CLINIC_OR_DEPARTMENT_OTHER)
Admission: RE | Admit: 2019-07-08 | Discharge: 2019-07-08 | Disposition: A | Discharge status: Home / Self Care | Payer: BC Managed Care – PPO | Attending: General Surgery | Admitting: General Surgery

## 2019-07-08 ENCOUNTER — Ambulatory Visit
Admission: RE | Admit: 2019-07-08 | Discharge: 2019-07-08 | Disposition: A | Discharge status: Home / Self Care | Payer: BC Managed Care – PPO | Source: Ambulatory Visit | Attending: General Surgery | Admitting: General Surgery

## 2019-07-08 ENCOUNTER — Encounter (HOSPITAL_BASED_OUTPATIENT_CLINIC_OR_DEPARTMENT_OTHER)
Admission: RE | Disposition: A | Discharge status: Home / Self Care | Payer: Self-pay | Source: Home / Self Care | Attending: General Surgery

## 2019-07-08 ENCOUNTER — Encounter (HOSPITAL_BASED_OUTPATIENT_CLINIC_OR_DEPARTMENT_OTHER): Payer: Self-pay | Admitting: General Surgery

## 2019-07-08 ENCOUNTER — Other Ambulatory Visit: Payer: Self-pay

## 2019-07-08 DIAGNOSIS — D0511 Intraductal carcinoma in situ of right breast: Secondary | ICD-10-CM | POA: Diagnosis present

## 2019-07-08 DIAGNOSIS — Z171 Estrogen receptor negative status [ER-]: Secondary | ICD-10-CM | POA: Diagnosis not present

## 2019-07-08 DIAGNOSIS — Z79899 Other long term (current) drug therapy: Secondary | ICD-10-CM | POA: Diagnosis not present

## 2019-07-08 DIAGNOSIS — G43909 Migraine, unspecified, not intractable, without status migrainosus: Secondary | ICD-10-CM | POA: Insufficient documentation

## 2019-07-08 DIAGNOSIS — Z803 Family history of malignant neoplasm of breast: Secondary | ICD-10-CM | POA: Diagnosis not present

## 2019-07-08 HISTORY — PX: BREAST LUMPECTOMY WITH RADIOACTIVE SEED LOCALIZATION: SHX6424

## 2019-07-08 SURGERY — BREAST LUMPECTOMY WITH RADIOACTIVE SEED LOCALIZATION
Anesthesia: General | Site: Breast | Laterality: Right

## 2019-07-08 MED ORDER — DEXAMETHASONE SODIUM PHOSPHATE 10 MG/ML IJ SOLN
INTRAMUSCULAR | Status: DC | PRN
  Administered 2019-07-08: 4 mg via INTRAVENOUS

## 2019-07-08 MED ORDER — ACETAMINOPHEN 500 MG PO TABS
1000.0000 mg | ORAL_TABLET | ORAL | Status: AC
  Administered 2019-07-08: 1000 mg via ORAL

## 2019-07-08 MED ORDER — HYDROMORPHONE HCL 1 MG/ML IJ SOLN: 0.2500 mg | INTRAMUSCULAR | Status: DC | PRN

## 2019-07-08 MED ORDER — FENTANYL CITRATE (PF) 250 MCG/5ML IJ SOLN
INTRAMUSCULAR | Status: DC | PRN
  Administered 2019-07-08 (×2): 25 ug via INTRAVENOUS

## 2019-07-08 MED ORDER — KETOROLAC TROMETHAMINE 15 MG/ML IJ SOLN
INTRAMUSCULAR | Status: AC
  Filled 2019-07-08: qty 1

## 2019-07-08 MED ORDER — FENTANYL CITRATE (PF) 100 MCG/2ML IJ SOLN
INTRAMUSCULAR | Status: AC
  Filled 2019-07-08: qty 2

## 2019-07-08 MED ORDER — OXYCODONE HCL 5 MG/5ML PO SOLN: 5.0000 mg | Freq: Once | ORAL | Status: DC | PRN

## 2019-07-08 MED ORDER — TRAMADOL HCL 50 MG PO TABS: 50.0000 mg | ORAL_TABLET | Freq: Four times a day (QID) | ORAL | 0 refills | Status: DC | PRN

## 2019-07-08 MED ORDER — PROPOFOL 10 MG/ML IV BOLUS
INTRAVENOUS | Status: DC | PRN
  Administered 2019-07-08: 200 mg via INTRAVENOUS

## 2019-07-08 MED ORDER — MIDAZOLAM HCL 2 MG/2ML IJ SOLN
INTRAMUSCULAR | Status: AC
  Filled 2019-07-08: qty 2

## 2019-07-08 MED ORDER — DEXAMETHASONE SODIUM PHOSPHATE 10 MG/ML IJ SOLN
INTRAMUSCULAR | Status: AC
  Filled 2019-07-08: qty 1

## 2019-07-08 MED ORDER — TRAMADOL HCL 50 MG PO TABS
50.0000 mg | ORAL_TABLET | Freq: Once | ORAL | Status: AC
  Administered 2019-07-08: 50 mg via ORAL

## 2019-07-08 MED ORDER — ONDANSETRON HCL 4 MG/2ML IJ SOLN
INTRAMUSCULAR | Status: AC
  Filled 2019-07-08: qty 2

## 2019-07-08 MED ORDER — CEFAZOLIN SODIUM-DEXTROSE 2-4 GM/100ML-% IV SOLN
INTRAVENOUS | Status: AC
  Filled 2019-07-08: qty 100

## 2019-07-08 MED ORDER — MIDAZOLAM HCL 5 MG/5ML IJ SOLN
INTRAMUSCULAR | Status: DC | PRN
  Administered 2019-07-08: 2 mg via INTRAVENOUS

## 2019-07-08 MED ORDER — ONDANSETRON HCL 4 MG/2ML IJ SOLN
INTRAMUSCULAR | Status: DC | PRN
  Administered 2019-07-08: 4 mg via INTRAVENOUS

## 2019-07-08 MED ORDER — PROPOFOL 10 MG/ML IV BOLUS
INTRAVENOUS | Status: AC
  Filled 2019-07-08: qty 20

## 2019-07-08 MED ORDER — MIDAZOLAM HCL 2 MG/2ML IJ SOLN: 1.0000 mg | INTRAMUSCULAR | Status: DC | PRN

## 2019-07-08 MED ORDER — OXYCODONE HCL 5 MG PO TABS: 5.0000 mg | ORAL_TABLET | Freq: Once | ORAL | Status: DC | PRN

## 2019-07-08 MED ORDER — LACTATED RINGERS IV SOLN: INTRAVENOUS | Status: DC

## 2019-07-08 MED ORDER — LIDOCAINE 2% (20 MG/ML) 5 ML SYRINGE
INTRAMUSCULAR | Status: AC
  Filled 2019-07-08: qty 5

## 2019-07-08 MED ORDER — LIDOCAINE 2% (20 MG/ML) 5 ML SYRINGE
INTRAMUSCULAR | Status: DC | PRN
  Administered 2019-07-08: 60 mg via INTRAVENOUS

## 2019-07-08 MED ORDER — CEFAZOLIN SODIUM-DEXTROSE 2-4 GM/100ML-% IV SOLN
2.0000 g | INTRAVENOUS | Status: AC
  Administered 2019-07-08: 11:00:00 2 g via INTRAVENOUS

## 2019-07-08 MED ORDER — OXYCODONE HCL 5 MG PO TABS
ORAL_TABLET | ORAL | Status: AC
  Filled 2019-07-08: qty 1

## 2019-07-08 MED ORDER — PROMETHAZINE HCL 25 MG/ML IJ SOLN: 6.2500 mg | INTRAMUSCULAR | Status: DC | PRN

## 2019-07-08 MED ORDER — TRAMADOL HCL 50 MG PO TABS
ORAL_TABLET | ORAL | Status: AC
  Filled 2019-07-08: qty 1

## 2019-07-08 MED ORDER — KETOROLAC TROMETHAMINE 15 MG/ML IJ SOLN
15.0000 mg | INTRAMUSCULAR | Status: AC
  Administered 2019-07-08: 15 mg via INTRAVENOUS

## 2019-07-08 MED ORDER — BUPIVACAINE HCL (PF) 0.25 % IJ SOLN
INTRAMUSCULAR | Status: DC | PRN
  Administered 2019-07-08: 10 mL

## 2019-07-08 MED ORDER — ACETAMINOPHEN 500 MG PO TABS
ORAL_TABLET | ORAL | Status: AC
  Filled 2019-07-08: qty 2

## 2019-07-08 MED ORDER — FENTANYL CITRATE (PF) 100 MCG/2ML IJ SOLN: 50.0000 ug | INTRAMUSCULAR | Status: DC | PRN

## 2019-07-08 SURGICAL SUPPLY — 66 items
ADH SKN CLS APL DERMABOND .7 (GAUZE/BANDAGES/DRESSINGS) ×1
APL PRP STRL LF DISP 70% ISPRP (MISCELLANEOUS) ×1
APPLIER CLIP 9.375 MED OPEN (MISCELLANEOUS)
APR CLP MED 9.3 20 MLT OPN (MISCELLANEOUS)
BINDER BREAST LRG (GAUZE/BANDAGES/DRESSINGS) ×1 IMPLANT
BINDER BREAST MEDIUM (GAUZE/BANDAGES/DRESSINGS) IMPLANT
BINDER BREAST XLRG (GAUZE/BANDAGES/DRESSINGS) IMPLANT
BINDER BREAST XXLRG (GAUZE/BANDAGES/DRESSINGS) IMPLANT
BLADE SURG 15 STRL LF DISP TIS (BLADE) ×1 IMPLANT
BLADE SURG 15 STRL SS (BLADE) ×2
CANISTER SUC SOCK COL 7IN (MISCELLANEOUS) IMPLANT
CANISTER SUCT 1200ML W/VALVE (MISCELLANEOUS) ×1 IMPLANT
CHLORAPREP W/TINT 26 (MISCELLANEOUS) ×2 IMPLANT
CLIP APPLIE 9.375 MED OPEN (MISCELLANEOUS) IMPLANT
CLIP VESOCCLUDE SM WIDE 6/CT (CLIP) ×1 IMPLANT
COVER BACK TABLE 60X90IN (DRAPES) ×2 IMPLANT
COVER MAYO STAND STRL (DRAPES) ×2 IMPLANT
COVER PROBE W GEL 5X96 (DRAPES) ×2 IMPLANT
COVER WAND RF STERILE (DRAPES) IMPLANT
DECANTER SPIKE VIAL GLASS SM (MISCELLANEOUS) IMPLANT
DERMABOND ADVANCED (GAUZE/BANDAGES/DRESSINGS) ×1
DERMABOND ADVANCED .7 DNX12 (GAUZE/BANDAGES/DRESSINGS) ×1 IMPLANT
DRAPE LAPAROSCOPIC ABDOMINAL (DRAPES) ×2 IMPLANT
DRAPE UTILITY XL STRL (DRAPES) ×2 IMPLANT
DRSG TEGADERM 4X4.75 (GAUZE/BANDAGES/DRESSINGS) IMPLANT
ELECT COATED BLADE 2.86 ST (ELECTRODE) ×2 IMPLANT
ELECT REM PT RETURN 9FT ADLT (ELECTROSURGICAL) ×2
ELECTRODE REM PT RTRN 9FT ADLT (ELECTROSURGICAL) ×1 IMPLANT
GAUZE SPONGE 4X4 12PLY STRL LF (GAUZE/BANDAGES/DRESSINGS) IMPLANT
GLOVE BIO SURGEON STRL SZ7 (GLOVE) ×4 IMPLANT
GLOVE BIO SURGEON STRL SZ7.5 (GLOVE) ×1 IMPLANT
GLOVE BIOGEL PI IND STRL 6.5 (GLOVE) IMPLANT
GLOVE BIOGEL PI IND STRL 7.0 (GLOVE) IMPLANT
GLOVE BIOGEL PI IND STRL 7.5 (GLOVE) ×1 IMPLANT
GLOVE BIOGEL PI IND STRL 8 (GLOVE) IMPLANT
GLOVE BIOGEL PI INDICATOR 6.5 (GLOVE) ×1
GLOVE BIOGEL PI INDICATOR 7.0 (GLOVE) ×1
GLOVE BIOGEL PI INDICATOR 7.5 (GLOVE) ×1
GLOVE BIOGEL PI INDICATOR 8 (GLOVE) ×1
GLOVE SURG SS PI 6.5 STRL IVOR (GLOVE) ×1 IMPLANT
GOWN STRL REUS W/ TWL LRG LVL3 (GOWN DISPOSABLE) ×2 IMPLANT
GOWN STRL REUS W/TWL LRG LVL3 (GOWN DISPOSABLE) ×8
HEMOSTAT ARISTA ABSORB 3G PWDR (HEMOSTASIS) IMPLANT
KIT MARKER MARGIN INK (KITS) ×2 IMPLANT
NDL HYPO 25X1 1.5 SAFETY (NEEDLE) ×1 IMPLANT
NEEDLE HYPO 25X1 1.5 SAFETY (NEEDLE) ×2 IMPLANT
NS IRRIG 1000ML POUR BTL (IV SOLUTION) IMPLANT
PACK BASIN DAY SURGERY FS (CUSTOM PROCEDURE TRAY) ×2 IMPLANT
PENCIL SMOKE EVACUATOR (MISCELLANEOUS) ×2 IMPLANT
RETRACTOR ONETRAX LX 90X20 (MISCELLANEOUS) IMPLANT
SLEEVE SCD COMPRESS KNEE MED (MISCELLANEOUS) ×2 IMPLANT
SPONGE LAP 4X18 RFD (DISPOSABLE) ×3 IMPLANT
STRIP CLOSURE SKIN 1/2X4 (GAUZE/BANDAGES/DRESSINGS) ×2 IMPLANT
SUT MNCRL AB 4-0 PS2 18 (SUTURE) ×2 IMPLANT
SUT MON AB 5-0 PS2 18 (SUTURE) ×1 IMPLANT
SUT SILK 2 0 SH (SUTURE) ×1 IMPLANT
SUT VIC AB 2-0 SH 27 (SUTURE) ×2
SUT VIC AB 2-0 SH 27XBRD (SUTURE) ×1 IMPLANT
SUT VIC AB 3-0 SH 27 (SUTURE) ×2
SUT VIC AB 3-0 SH 27X BRD (SUTURE) ×1 IMPLANT
SUT VIC AB 5-0 PS2 18 (SUTURE) IMPLANT
SYR CONTROL 10ML LL (SYRINGE) ×2 IMPLANT
TOWEL GREEN STERILE FF (TOWEL DISPOSABLE) ×2 IMPLANT
TRAY FAXITRON CT DISP (TRAY / TRAY PROCEDURE) ×2 IMPLANT
TUBE CONNECTING 20X1/4 (TUBING) IMPLANT
YANKAUER SUCT BULB TIP NO VENT (SUCTIONS) IMPLANT

## 2019-07-08 NOTE — H&P (Signed)
64 yof referred by Dr Stann Mainland for new right breast dcis. she has family history in her mom at age 62. she has no prior breast history. she had no mass or dc. she had screening mm that shows c density breasts. there are some stable right breast calcs but there in uoq of right breast are 1.5 cm of new calcifications. 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 she is here with her daughter to discuss options today  Past Surgical History  Appendectomy  Breast Biopsy  Right. Tonsillectomy   Diagnostic Studies History  Colonoscopy  never Mammogram  within last year Pap Smear  1-5 years ago  Allergies  Sulfur (Antiseborrheic) *DERMATOLOGICALS*  Allergies Reconciled   Medication History  Rizatriptan Benzoate (10MG  Tablet, Oral) Active. Multiple Minerals-Vitamins (Oral) Active. Medications Reconciled  Social History  Alcohol use  Occasional alcohol use. Caffeine use  Carbonated beverages, Coffee, Tea. No drug use  Tobacco use  Never smoker.  Family History  Breast Cancer  Mother. Cerebrovascular Accident  Mother. Hypertension  Brother, Mother.  Pregnancy / Birth History  Age at menarche  26 years. Age of menopause  67-60 Gravida  3 Maternal age  55-25 Para  2 Regular periods   Other Problems  Hypercholesterolemia  Migraine Headache    Review of Systems  General Not Present- Appetite Loss, Chills, Fatigue, Fever, Night Sweats, Weight Gain and Weight Loss. Skin Not Present- Change in Wart/Mole, Dryness, Hives, Jaundice, New Lesions, Non-Healing Wounds, Rash and Ulcer. HEENT Present- Wears glasses/contact lenses. Not Present- Earache, Hearing Loss, Hoarseness, Nose Bleed, Oral Ulcers, Ringing in the Ears, Seasonal Allergies, Sinus Pain, Sore Throat, Visual Disturbances and Yellow Eyes. Respiratory Not Present- Bloody sputum, Chronic Cough, Difficulty Breathing, Snoring and Wheezing. Breast Not Present- Breast Mass,  Breast Pain, Nipple Discharge and Skin Changes. Cardiovascular Not Present- Chest Pain, Difficulty Breathing Lying Down, Leg Cramps, Palpitations, Rapid Heart Rate, Shortness of Breath and Swelling of Extremities. Gastrointestinal Not Present- Abdominal Pain, Bloating, Bloody Stool, Change in Bowel Habits, Chronic diarrhea, Constipation, Difficulty Swallowing, Excessive gas, Gets full quickly at meals, Hemorrhoids, Indigestion, Nausea, Rectal Pain and Vomiting. Female Genitourinary Not Present- Frequency, Nocturia, Painful Urination, Pelvic Pain and Urgency. Musculoskeletal Not Present- Back Pain, Joint Pain, Joint Stiffness, Muscle Pain, Muscle Weakness and Swelling of Extremities. Neurological Not Present- Decreased Memory, Fainting, Headaches, Numbness, Seizures, Tingling, Tremor, Trouble walking and Weakness. Psychiatric Not Present- Anxiety, Bipolar, Change in Sleep Pattern, Depression, Fearful and Frequent crying. Endocrine Not Present- Cold Intolerance, Excessive Hunger, Hair Changes, Heat Intolerance, Hot flashes and New Diabetes. Hematology Not Present- Blood Thinners, Easy Bruising, Excessive bleeding, Gland problems, HIV and Persistent Infections.   Physical Exam  General Mental Status-Alert. Orientation-Oriented X3. Head and Neck Trachea-midline. Eye Sclera/Conjunctiva - Bilateral-No scleral icterus. Chest and Lung Exam Chest and lung exam reveals -quiet, even and easy respiratory effort with no use of accessory muscles. Breast Nipples-No Discharge. Breast Lump-No Palpable Breast Mass. Cardiovascular Cardiovascular examination reveals -normal heart sounds, regular rate and rhythm with no murmurs. Neurologic Neurologic evaluation reveals -alert and oriented x 3 with no impairment of recent or remote memory. Lymphatic Head & Neck General Head & Neck Lymphatics: Bilateral - Description - Normal. Axillary General Axillary Region: Bilateral - Description -  Normal. Note: no [place] adenopathy   Assessment & Plan  BREAST NEOPLASM, TIS (DCIS), RIGHT (D05.11) Story: Right breast seed guided lumpectomy We discussed the staging and pathophysiology of breast cancer. We discussed all of the different options  for treatment for breast cancer including surgery, chemotherapy, radiation therapy, Herceptin, and antiestrogen therapy. She does not need a sentinel node biopsy for this. We discussed she is not a candidate for the COMET trial which she has researched.. She doesn't technically meet genetics criteria from NCCN but ASBS would recommend and due to having two daughters and mom with bca she would like to pursue. We discussed the options for treatment of the breast cancer which included lumpectomy versus a mastectomy. We discussed the performance of the lumpectomy with radioactive seed placement. We discussed a 5-10% chance of a positive margin requiring reexcision in the operating room. We also discussed that she will need radiation therapy if she undergoes lumpectomy. The breast cannot undergo more radiation therapy in the same breast after lumpectomy in the future. We discussed mastectomy and the postoperative care for that as well. Mastectomy can be followed by reconstruction. The decision for lumpectomy vs mastectomy has no impact on decision for chemotherapy. Most mastectomy patients will not need radiation therapy. We discussed that there is no difference in her survival whether she undergoes lumpectomy with radiation therapy or antiestrogen therapy versus a mastectomy. There is also no real difference between her recurrence in the breast. We discussed the risks of operation including bleeding, infection, possible reoperation. She understands her further therapy will be based on what her stages at the time of her operation.

## 2019-07-08 NOTE — Discharge Instructions (Signed)
[place] Office Phone Number [phone]  BREAST BIOPSY/ PARTIAL MASTECTOMY: POST OP INSTRUCTIONS Take 400 mg of ibuprofen every 8 hours or 650 mg tylenol every 6 hours for next 72 hours then as needed. Use ice several times daily also. Always review your discharge instruction sheet given to you by the facility where your surgery was performed.  IF YOU HAVE DISABILITY OR FAMILY LEAVE FORMS, YOU MUST BRING THEM TO THE OFFICE FOR PROCESSING.  DO NOT GIVE THEM TO YOUR DOCTOR.  1. A prescription for pain medication may be given to you upon discharge.  Take your pain medication as prescribed, if needed.  If narcotic pain medicine is not needed, then you may take acetaminophen (Tylenol), naprosyn (Alleve) or ibuprofen (Advil) as needed. NO TYLENOL OR IBUPROFEN UNTIL 4PM 2. Take your usually prescribed medications unless otherwise directed 3. If you need a refill on your pain medication, please contact your pharmacy.  They will contact our office to request authorization.  Prescriptions will not be filled after 5pm or on week-ends. 4. You should eat very light the first 24 hours after surgery, such as soup, crackers, pudding, etc.  Resume your normal diet the day after surgery. 5. Most patients will experience some swelling and bruising in the breast.  Ice packs and a good support bra will help.  Wear the breast binder provided or a sports bra for 72 hours day and night.  After that wear a sports bra during the day until you return to the office. Swelling and bruising can take several days to resolve.  6. It is common to experience some constipation if taking pain medication after surgery.  Increasing fluid intake and taking a stool softener will usually help or prevent this problem from occurring.  A mild laxative (Milk of Magnesia or Miralax) should be taken according to package directions if there are no bowel movements after 48 hours. 7. Unless discharge instructions indicate  otherwise, you may remove your bandages 48 hours after surgery and you may shower at that time.  You may have steri-strips (small skin tapes) in place directly over the incision.  These strips should be left on the skin for 7-10 days and will come off on their own.  If your surgeon used skin glue on the incision, you may shower in 24 hours.  The glue will flake off over the next 2-3 weeks.  Any sutures or staples will be removed at the office during your follow-up visit. 8. ACTIVITIES:  You may resume regular daily activities (gradually increasing) beginning the next day.  Wearing a good support bra or sports bra minimizes pain and swelling.  You may have sexual intercourse when it is comfortable. a. You may drive when you no longer are taking prescription pain medication, you can comfortably wear a seatbelt, and you can safely maneuver your car and apply brakes. b. RETURN TO WORK:  ______________________________________________________________________________________ 9. You should see your doctor in the office for a follow-up appointment approximately two weeks after your surgery.  Your doctor's nurse will typically make your follow-up appointment when she calls you with your pathology report.  Expect your pathology report 3-4 business days after your surgery.  You may call to check if you do not hear from [place] after three days. 10. OTHER INSTRUCTIONS: _______________________________________________________________________________________________ _____________________________________________________________________________________________________________________________________ _____________________________________________________________________________________________________________________________________ _____________________________________________________________________________________________________________________________________  WHEN TO CALL DR WAKEFIELD: 1. Fever over 101.0 2. Nausea and/or  vomiting. 3. Extreme swelling or bruising. 4. Continued bleeding from incision. 5. Increased pain, redness, or drainage  from the incision.  The clinic staff is available to answer your questions during regular business hours.  Please don't hesitate to call and ask to speak to one of the nurses for clinical concerns.  If you have a medical emergency, go to the nearest emergency room or call 911.  A surgeon from [facility] is always on call at the hospital.  For further questions, please visit [email or web address]    Post Anesthesia Home Care Instructions  Activity: Get plenty of rest for the remainder of the day. A responsible individual must stay with you for 24 hours following the procedure.  For the next 24 hours, DO NOT: -Drive a car -Paediatric nurse -Drink alcoholic beverages -Take any medication unless instructed by your physician -Make any legal decisions or sign important papers.  Meals: Start with liquid foods such as gelatin or soup. Progress to regular foods as tolerated. Avoid greasy, spicy, heavy foods. If nausea and/or vomiting occur, drink only clear liquids until the nausea and/or vomiting subsides. Call your physician if vomiting continues.  Special Instructions/Symptoms: Your throat may feel dry or sore from the anesthesia or the breathing tube placed in your throat during surgery. If this causes discomfort, gargle with warm salt water. The discomfort should disappear within 24 hours.  If you had a scopolamine patch placed behind your ear for the management of post- operative nausea and/or vomiting:  1. The medication in the patch is effective for 72 hours, after which it should be removed.  Wrap patch in a tissue and discard in the trash. Wash hands thoroughly with soap and water. 2. You may remove the patch earlier than 72 hours if you experience unpleasant side effects which may include dry mouth, dizziness or visual disturbances. 3. Avoid  touching the patch. Wash your hands with soap and water after contact with the patch.

## 2019-07-08 NOTE — Transfer of Care (Signed)
Immediate Anesthesia Transfer of Care Note  Patient: Samantha Livingston  Procedure(s) Performed: RIGHT BREAST LUMPECTOMY WITH RADIOACTIVE SEED LOCALIZATION (Right Breast)  Patient Location: PACU  Anesthesia Type:General  Level of Consciousness: sedated  Airway & Oxygen Therapy: Patient Spontanous Breathing and Patient connected to face mask oxygen  Post-op Assessment: Report given to RN and Post -op Vital signs reviewed and stable  Post vital signs: Reviewed and stable  Last Vitals:  Vitals Value Taken Time  BP 106/54 07/08/19 1204  Temp    Pulse 74 07/08/19 1206  Resp 13 07/08/19 1206  SpO2 100 % 07/08/19 1206  Vitals shown include unvalidated device data.  Last Pain:  Vitals:   07/08/19 0937  TempSrc: Oral         Complications: No apparent anesthesia complications

## 2019-07-08 NOTE — Interval H&P Note (Signed)
History and Physical Interval Note:  07/08/2019 11:03 AM  Samantha Livingston  has presented today for surgery, with the diagnosis of RIGHT BREAST DCIS.  The various methods of treatment have been discussed with the patient and family. After consideration of risks, benefits and other options for treatment, the patient has consented to  Procedure(s): RIGHT BREAST LUMPECTOMY WITH RADIOACTIVE SEED LOCALIZATION (Right) as a surgical intervention.  The patient's history has been reviewed, patient examined, no change in status, stable for surgery.  I have reviewed the patient's chart and labs.  Questions were answered to the patient's satisfaction.     [name]

## 2019-07-08 NOTE — Anesthesia Procedure Notes (Signed)
Procedure Name: LMA Insertion Date/Time: 07/08/2019 11:10 AM Performed by: Myna Bright, CRNA Pre-anesthesia Checklist: Patient identified, Emergency Drugs available, Patient being monitored and Suction available Patient Re-evaluated:Patient Re-evaluated prior to induction Oxygen Delivery Method: Circle system utilized Preoxygenation: Pre-oxygenation with 100% oxygen Induction Type: IV induction Ventilation: Mask ventilation without difficulty LMA: LMA inserted LMA Size: 4.0 Tube type: Oral Number of attempts: 1 Placement Confirmation: positive ETCO2 and breath sounds checked- equal and bilateral Tube secured with: Tape Dental Injury: Teeth and Oropharynx as per pre-operative assessment

## 2019-07-08 NOTE — Interval H&P Note (Signed)
History and Physical Interval Note:  07/08/2019 10:48 AM  Samantha Livingston  has presented today for surgery, with the diagnosis of RIGHT BREAST DCIS.  The various methods of treatment have been discussed with the patient and family. After consideration of risks, benefits and other options for treatment, the patient has consented to  Procedure(s): RIGHT BREAST LUMPECTOMY WITH RADIOACTIVE SEED LOCALIZATION (Right) as a surgical intervention.  The patient's history has been reviewed, patient examined, no change in status, stable for surgery.  I have reviewed the patient's chart and labs.  Questions were answered to the patient's satisfaction.     [name]

## 2019-07-08 NOTE — Anesthesia Preprocedure Evaluation (Addendum)
Anesthesia Evaluation  Patient identified by MRN, date of birth, ID band Patient awake    Reviewed: Allergy & Precautions, NPO status , Patient's Chart, lab work & pertinent test results  Airway Mallampati: II  TM Distance: >3 FB Neck ROM: Full    Dental no notable dental hx.    Pulmonary neg pulmonary ROS,    Pulmonary exam normal breath sounds clear to auscultation       Cardiovascular negative cardio ROS Normal cardiovascular exam Rhythm:Regular Rate:Normal     Neuro/Psych  Headaches, negative psych ROS   GI/Hepatic Neg liver ROS, Irritable bowel syndrome   Endo/Other  negative endocrine ROS  Renal/GU negative Renal ROS     Musculoskeletal negative musculoskeletal ROS (+)   Abdominal   Peds  Hematology negative hematology ROS (+)   Anesthesia Other Findings RIGHT BREAST DCIS  Reproductive/Obstetrics                            Anesthesia Physical Anesthesia Plan  ASA: I  Anesthesia Plan: General   Post-op Pain Management:    Induction: Intravenous  PONV Risk Score and Plan: 3 and Ondansetron, Dexamethasone, Midazolam and Treatment may vary due to age or medical condition  Airway Management Planned: LMA  Additional Equipment:   Intra-op Plan:   Post-operative Plan: Extubation in OR  Informed Consent: I have reviewed the patients History and Physical, chart, labs and discussed the procedure including the risks, benefits and alternatives for the proposed anesthesia with the patient or authorized representative who has indicated his/her understanding and acceptance.     Dental advisory given  Plan Discussed with: CRNA  Anesthesia Plan Comments:        Anesthesia Quick Evaluation

## 2019-07-08 NOTE — Op Note (Signed)
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 Serita Grammes Anesthesia: General Estimated blood loss: Minimal Specimens: 1.  Right breast tissue marked with paint containing seed and clip 2.  Additional medial, superior, posterior margins marked short stitch superior, long stitch lateral, double stitch deep 3. Additional anterior margin short superior double deep  Complications: None Drains: None Sponge and count was correct completion Disposition recovery stable condition  Indications: 3 yof I saw last month with new right breast dcis.  she had screening mm that shows c density breasts. there are some stable right breast calcs but there in uoq of right breast are 1.5 cm of new calcifications. this has undergone stereo biopsy and is hg dcis that is er/pr negative.genetics negative. She has elected to undergo seed lumpectomy.   Procedure: After informed consent was obtained the patient was taken to the operating room.  She had had a radioactive seed placed prior to beginning.  I had these mammograms in the operating room.  She was given antibiotics.  SCDs were placed.  She was placed under general anesthesia without complication.  She was prepped and draped in the standard sterile surgical fashion.  Surgical timeout was then performed.  I located the seed which was in the right uoq. I infiltrated marcaine and then made a periareolar incision to hide the scar.   I then used cautery to excise the seed and the surrounding tissue and attempt to get a clear margin.  I remove this.  I used paint to mark it it.  I then took a mammogram which confirmed removal of the seed and the clip.  On the 3D images it looks like it would be close to several margins and I did excise these.  I marked them as above.  I then obtained hemostasis.  I placed clips in the cavity.  I  then closed the breast tissue with 2-0 Vicryl.  The skin was closed with 3-0 Vicryl and 5-0  Monocryl.  Glue and Steri-Strips were applied.  She tolerated this well was extubated and transferred to recovery stable.

## 2019-07-08 NOTE — Anesthesia Postprocedure Evaluation (Signed)
Anesthesia Post Note  Patient: Samantha Livingston  Procedure(s) Performed: RIGHT BREAST LUMPECTOMY WITH RADIOACTIVE SEED LOCALIZATION (Right Breast)     Patient location during evaluation: PACU Anesthesia Type: General Level of consciousness: awake and alert Pain management: pain level controlled Vital Signs Assessment: post-procedure vital signs reviewed and stable Respiratory status: spontaneous breathing, nonlabored ventilation, respiratory function stable and patient connected to nasal cannula oxygen Cardiovascular status: blood pressure returned to baseline and stable Postop Assessment: no apparent nausea or vomiting Anesthetic complications: no    Last Vitals:  Vitals:   07/08/19 1230 07/08/19 1246  BP: 124/68 132/86  Pulse: 73 70  Resp: 13 16  Temp:  36.4 C  SpO2: 99% 100%    Last Pain:  Vitals:   07/08/19 1246  TempSrc: Oral  PainSc: 3                  Stephanye Finnicum P Misheel Gowans

## 2019-07-09 ENCOUNTER — Encounter: Payer: Self-pay | Admitting: *Deleted

## 2019-07-10 ENCOUNTER — Other Ambulatory Visit: Payer: Self-pay

## 2019-07-10 ENCOUNTER — Encounter: Payer: Self-pay | Admitting: *Deleted

## 2019-07-11 LAB — SURGICAL PATHOLOGY

## 2019-07-14 ENCOUNTER — Encounter: Payer: Self-pay | Admitting: *Deleted

## 2019-07-29 ENCOUNTER — Ambulatory Visit
Admission: RE | Admit: 2019-07-29 | Discharge: 2019-07-29 | Disposition: A | Discharge status: Home / Self Care | Payer: BC Managed Care – PPO | Source: Ambulatory Visit | Attending: Radiation Oncology | Admitting: Radiation Oncology

## 2019-07-29 ENCOUNTER — Other Ambulatory Visit: Payer: Self-pay

## 2019-07-29 DIAGNOSIS — Z510 Encounter for antineoplastic radiation therapy: Secondary | ICD-10-CM | POA: Diagnosis not present

## 2019-07-29 DIAGNOSIS — D0511 Intraductal carcinoma in situ of right breast: Secondary | ICD-10-CM | POA: Diagnosis not present

## 2019-07-29 DIAGNOSIS — Z171 Estrogen receptor negative status [ER-]: Secondary | ICD-10-CM

## 2019-07-29 DIAGNOSIS — C50411 Malignant neoplasm of upper-outer quadrant of right female breast: Secondary | ICD-10-CM | POA: Insufficient documentation

## 2019-07-29 DIAGNOSIS — Z170 Estrogen receptor positive status [ER+]: Secondary | ICD-10-CM | POA: Insufficient documentation

## 2019-07-29 DIAGNOSIS — Z9889 Other specified postprocedural states: Secondary | ICD-10-CM | POA: Diagnosis not present

## 2019-07-29 DIAGNOSIS — Z803 Family history of malignant neoplasm of breast: Secondary | ICD-10-CM | POA: Diagnosis not present

## 2019-07-29 NOTE — Progress Notes (Signed)
Radiation Oncology         [phone] ________________________________  Name: Samantha Livingston        MRN: [id number]  Date of Service: 07/29/2019 DOB: Mar 26, 1956  KD:TOIZTI, Pcp Not In  [name], MD     REFERRING PHYSICIAN: [name], MD   DIAGNOSIS: The encounter diagnosis was Malignant neoplasm of upper-outer quadrant of right breast in female, estrogen receptor negative ([place]).   HISTORY OF PRESENT ILLNESS: Samantha Livingston is a 64 y.o. female  with right breast cancer. The patient was noted to have calcifications in the right breast several years ago and had been undergoing diagnostic imaging of the breast.  On 05/05/2019 diagnostic images revealed 15 mm group of calcifications in the right breast.  She also had stability of punctate calcifications also in the upper right breast but been followed and stable for approximately 3 years time.  She underwent a stereotactic biopsy on 05/09/2019 which revealed high-grade DCIS with calcifications, her ER and PR receptors were negative.  She underwent lumpectomy on 07/08/2019. Final pathology revealed a 1 cm high-grade DCIS that appeared to be involving the lobules, her posterior and superior margins were less than 1 mm, but additional resections within the specimens were negative.  She continues to see Dr. Hinton Rao for medical oncology in [place], and she is seen today via Mychart to discuss options of adjuvant radiotherapy.    PREVIOUS RADIATION THERAPY: No   PAST MEDICAL HISTORY:  Past Medical History:  Diagnosis Date  . 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 Family history of breast cancer   . Family history of melanoma   . Family history of stomach cancer   . Irritable bowel syndrome   . Migraine with aura, without mention of intractable migraine without mention of status migrainosus        PAST SURGICAL HISTORY: Past Surgical History:  Procedure Laterality Date  . APPENDECTOMY  02-23-2006  . BREAST LUMPECTOMY WITH  RADIOACTIVE SEED LOCALIZATION Right 07/08/2019   Procedure: RIGHT BREAST LUMPECTOMY WITH RADIOACTIVE SEED LOCALIZATION;  Surgeon: [name], MD;  Location: [facility];  Service: General;  Laterality: Right;  . TONSILLECTOMY  1969  . TUBAL LIGATION  1983  . UPPER GASTROINTESTINAL ENDOSCOPY  1980     FAMILY HISTORY:  Family History  Problem Relation Age of Onset  . Breast cancer Mother        2009  . Bone cancer Mother        05/2013  . Cancer Mother        Breast with metastatic disease to bone  . CVA Mother   . Melanoma Brother 28  . Stomach cancer Maternal Aunt 75  . Cancer Maternal Grandfather        Throat  . Breast cancer Other 45       MGF's sister     SOCIAL HISTORY:  reports that she has never smoked. She has never used smokeless tobacco. She reports that she does not drink alcohol or use drugs.  The patient is single and lives in [place].  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 Her daughter lives nearby.    ALLERGIES: Sulfur   MEDICATIONS:  Current Outpatient Medications  Medication Sig Dispense Refill  . Multiple Vitamin (MULTIVITAMIN) tablet Take 1 tablet by mouth daily.    . rizatriptan (MAXALT) 10 MG tablet Take one tablet by mouth at onset of migraine headaches. May repeat in 2 hours  if needed 8 tablet 5  . traMADol (ULTRAM) 50 MG tablet Take 1 tablet (50 mg total) by mouth every 6 (six) hours as needed. 10 tablet 0   No current facility-administered medications for this encounter.     REVIEW OF SYSTEMS: On review of systems, the patient reports that she is doing well overall.  She states that she is healing quite well and has had some mild soreness deep to the incision line but is otherwise feeling like she is doing quite well. No other complaints were verbalized.     PHYSICAL EXAM:  Unable to assess due to encounter type.  In general this is a well appearing Caucasian female in no acute distress.   She's alert and oriented x4 and appropriate throughout the examination. Cardiopulmonary assessment is negative for acute distress and she exhibits normal effort. Bilateral breast exam is deferred.   ECOG = 0  0 - Asymptomatic (Fully active, able to carry on all predisease activities without restriction)  1 - Symptomatic but completely ambulatory (Restricted in physically strenuous activity but ambulatory and able to carry out work of a light or sedentary nature. For example, light housework, office work)  2 - Symptomatic, <50% in bed during the day (Ambulatory and capable of all self care but unable to carry out any work activities. Up and about more than 50% of waking hours)  3 - Symptomatic, >50% in bed, but not bedbound (Capable of only limited self-care, confined to bed or chair 50% or more of waking hours)  4 - Bedbound (Completely disabled. Cannot carry on any self-care. Totally confined to bed or chair)  5 - Death   Eustace Pen MM, Creech RH, Tormey DC, et al. [phone]). "Toxicity and response criteria of the [facility]". [place] Oncol. 5 (6): 649-55    LABORATORY DATA:  Lab Results  Component Value Date   WBC 7.5 11/14/2013   HGB 14.7 11/14/2013   HCT 41.3 11/14/2013   MCV 85 11/14/2013   PLT 321 02/26/2013   Lab Results  Component Value Date   NA 141 11/14/2013   K 4.3 11/14/2013   CL 101 11/14/2013   CO2 26 11/14/2013   Lab Results  Component Value Date   ALT 17 11/14/2013   AST 16 11/14/2013   ALKPHOS 104 11/14/2013   BILITOT 0.6 11/14/2013      RADIOGRAPHY: MM Breast Surgical Specimen  Result Date: 07/08/2019 CLINICAL DATA:  Right lumpectomy for recently diagnosed ductal carcinoma in situ. EXAM: SPECIMEN RADIOGRAPH OF THE RIGHT BREAST COMPARISON:  Previous exam(s). FINDINGS: Status post excision of the right breast. The radioactive seed and coil shaped biopsy marker clip are present, completely intact. The clip and seed are at the edge of  the specimen. This was communicated to the operating room nurse to relay to Dr. Donne Hazel. IMPRESSION: Specimen radiograph of the right breast. Electronically Signed   By: Claudie Revering M.D.   On: 07/08/2019 11:43   MM RT RADIOACTIVE SEED LOC MAMMO GUIDE  Result Date: 07/07/2019 CLINICAL DATA:  Biopsy proven high-grade ductal carcinoma in-situ. EXAM: MAMMOGRAPHIC GUIDED RADIOACTIVE SEED LOCALIZATION OF THE RIGHT BREAST COMPARISON:  Previous exam(s). FINDINGS: Patient presents for radioactive seed localization prior to surgery. I met with the patient and we discussed the procedure of seed localization including benefits and alternatives. We discussed the high likelihood of a successful procedure. We discussed the risks of the procedure including infection, bleeding, tissue injury and further surgery. We discussed the low dose  of radioactivity involved in the procedure. Informed, written consent was given. The usual time-out protocol was performed immediately prior to the procedure. Using mammographic guidance, sterile technique, 1% lidocaine and an I-125 radioactive seed, the coil shaped clip was localized using a lateral to medial approach. The follow-up mammogram images confirm the seed in the expected location and were marked for Dr. Donne Hazel. Follow-up survey of the patient confirms presence of the radioactive seed. Order number of I-125 seed:  [id number]. Total activity:  9.390 millicuries reference Date: 06/10/2019 The patient tolerated the procedure well and was released from the [place]. She was given instructions regarding seed removal. IMPRESSION: Radioactive seed localization right breast. No apparent complications. Electronically Signed   By: Lillia Mountain M.D.   On: 07/07/2019 14:23       IMPRESSION/PLAN: 1. 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 Dr. Lisbeth Renshaw discusses the final pathology findings and reviews the nature of noninvasive breast disease.  She appears to be doing well  following her surgical resection, and we again outlined the rationale for adjuvant radiotherapy which would be to reduce the risk of local recurrence.  She does have experience with family members who have gone through radiation and has been concerned about the toxicity that she might experience.  That being said after considering her concerns of talking through our impression of what we would expect the side effects to be like, she is interested in further pursuing this.  We discussed the risks, benefits, short, and long term effects of radiotherapy, and the patient is interested in proceeding. Dr. Lisbeth Renshaw discusses the delivery and logistics of radiotherapy and anticipates a course of 4 weeks of curative radiotherapy to the right breast.  She will come in later for simulation at which time she will signed written consent to proceed.  She will follow in the long-term with Dr. Hinton Rao as well.  This encounter was provided by telemedicine platform MyChart.  The patient has given verbal consent for this type of encounter and has been advised to only accept a meeting of this type in a secure network environment. The time spent during this encounter and in preparation was 45 minutes. The attendants for this meeting includeDr. Lisbeth Renshaw, Hayden Pedro  and Arvella Nigh.  During the encounter, Dr. Lisbeth Renshaw, and Hayden Pedro were located at [facility] Radiation Oncology Department.  Samantha Livingston was located at home.    The above documentation reflects my direct findings during this shared patient visit. Please see the separate note by Dr. Lisbeth Renshaw on this date for the remainder of the patient's plan of care.    Carola Rhine, PAC

## 2019-07-31 DIAGNOSIS — Z170 Estrogen receptor positive status [ER+]: Secondary | ICD-10-CM | POA: Diagnosis not present

## 2019-07-31 DIAGNOSIS — C50411 Malignant neoplasm of upper-outer quadrant of right female breast: Secondary | ICD-10-CM | POA: Diagnosis not present

## 2019-07-31 DIAGNOSIS — Z510 Encounter for antineoplastic radiation therapy: Secondary | ICD-10-CM | POA: Diagnosis not present

## 2019-08-01 ENCOUNTER — Telehealth: Payer: Self-pay | Admitting: Radiation Oncology

## 2019-08-05 ENCOUNTER — Ambulatory Visit
Admission: RE | Admit: 2019-08-05 | Discharge: 2019-08-05 | Disposition: A | Discharge status: Home / Self Care | Payer: BC Managed Care – PPO | Source: Ambulatory Visit | Attending: Radiation Oncology | Admitting: Radiation Oncology

## 2019-08-05 ENCOUNTER — Encounter: Payer: Self-pay | Admitting: *Deleted

## 2019-08-05 ENCOUNTER — Other Ambulatory Visit: Payer: Self-pay

## 2019-08-05 DIAGNOSIS — Z510 Encounter for antineoplastic radiation therapy: Secondary | ICD-10-CM | POA: Diagnosis not present

## 2019-08-05 DIAGNOSIS — C50411 Malignant neoplasm of upper-outer quadrant of right female breast: Secondary | ICD-10-CM | POA: Diagnosis not present

## 2019-08-05 DIAGNOSIS — Z170 Estrogen receptor positive status [ER+]: Secondary | ICD-10-CM | POA: Diagnosis not present

## 2019-08-06 ENCOUNTER — Other Ambulatory Visit: Payer: Self-pay

## 2019-08-06 ENCOUNTER — Ambulatory Visit
Admission: RE | Admit: 2019-08-06 | Discharge: 2019-08-06 | Disposition: A | Discharge status: Home / Self Care | Payer: BC Managed Care – PPO | Source: Ambulatory Visit | Attending: Radiation Oncology | Admitting: Radiation Oncology

## 2019-08-06 DIAGNOSIS — C50411 Malignant neoplasm of upper-outer quadrant of right female breast: Secondary | ICD-10-CM | POA: Diagnosis not present

## 2019-08-06 DIAGNOSIS — Z170 Estrogen receptor positive status [ER+]: Secondary | ICD-10-CM | POA: Diagnosis not present

## 2019-08-06 DIAGNOSIS — Z510 Encounter for antineoplastic radiation therapy: Secondary | ICD-10-CM | POA: Diagnosis not present

## 2019-08-07 ENCOUNTER — Other Ambulatory Visit: Payer: Self-pay

## 2019-08-07 ENCOUNTER — Ambulatory Visit
Admission: RE | Admit: 2019-08-07 | Discharge: 2019-08-07 | Disposition: A | Discharge status: Home / Self Care | Payer: BC Managed Care – PPO | Source: Ambulatory Visit | Attending: Radiation Oncology | Admitting: Radiation Oncology

## 2019-08-07 DIAGNOSIS — Z170 Estrogen receptor positive status [ER+]: Secondary | ICD-10-CM | POA: Diagnosis not present

## 2019-08-07 DIAGNOSIS — Z510 Encounter for antineoplastic radiation therapy: Secondary | ICD-10-CM | POA: Diagnosis not present

## 2019-08-07 DIAGNOSIS — C50411 Malignant neoplasm of upper-outer quadrant of right female breast: Secondary | ICD-10-CM | POA: Diagnosis not present

## 2019-08-08 ENCOUNTER — Ambulatory Visit
Admission: RE | Admit: 2019-08-08 | Discharge: 2019-08-08 | Disposition: A | Discharge status: Home / Self Care | Payer: BC Managed Care – PPO | Source: Ambulatory Visit | Attending: Radiation Oncology | Admitting: Radiation Oncology

## 2019-08-08 ENCOUNTER — Other Ambulatory Visit: Payer: Self-pay

## 2019-08-08 DIAGNOSIS — Z510 Encounter for antineoplastic radiation therapy: Secondary | ICD-10-CM | POA: Diagnosis not present

## 2019-08-08 DIAGNOSIS — C50411 Malignant neoplasm of upper-outer quadrant of right female breast: Secondary | ICD-10-CM | POA: Diagnosis not present

## 2019-08-08 DIAGNOSIS — Z170 Estrogen receptor positive status [ER+]: Secondary | ICD-10-CM | POA: Diagnosis not present

## 2019-08-08 DIAGNOSIS — Z171 Estrogen receptor negative status [ER-]: Secondary | ICD-10-CM

## 2019-08-08 MED ORDER — SONAFINE EX EMUL
1.0000 "application " | Freq: Once | CUTANEOUS | Status: AC
  Administered 2019-08-08: 1 via TOPICAL

## 2019-08-08 NOTE — Progress Notes (Signed)
Pt here for patient teaching.  Pt given Radiation and You booklet, skin care instructions and Sonafine.  Reviewed areas of pertinence such as fatigue, hair loss, skin changes, breast tenderness and breast swelling . Pt able to give teach back of to pat skin and use unscented/gentle soap,apply Sonafine bid, avoid applying anything to skin within 4 hours of treatment, avoid wearing an under wire bra and to use an electric razor if they must shave. Pt verbalizes understanding of information given and will contact nursing with any questions or concerns.  Patient verbalized she already uses non metallic deodorant.     Samantha Livingston. Leonie Green, BSN

## 2019-08-11 ENCOUNTER — Ambulatory Visit
Admission: RE | Admit: 2019-08-11 | Discharge: 2019-08-11 | Disposition: A | Discharge status: Home / Self Care | Payer: BC Managed Care – PPO | Source: Ambulatory Visit | Attending: Radiation Oncology | Admitting: Radiation Oncology

## 2019-08-11 ENCOUNTER — Other Ambulatory Visit: Payer: Self-pay

## 2019-08-11 DIAGNOSIS — Z510 Encounter for antineoplastic radiation therapy: Secondary | ICD-10-CM | POA: Diagnosis not present

## 2019-08-11 DIAGNOSIS — C50411 Malignant neoplasm of upper-outer quadrant of right female breast: Secondary | ICD-10-CM | POA: Diagnosis not present

## 2019-08-11 DIAGNOSIS — Z170 Estrogen receptor positive status [ER+]: Secondary | ICD-10-CM | POA: Diagnosis not present

## 2019-08-12 ENCOUNTER — Ambulatory Visit
Admission: RE | Admit: 2019-08-12 | Discharge: 2019-08-12 | Disposition: A | Discharge status: Home / Self Care | Payer: BC Managed Care – PPO | Source: Ambulatory Visit | Attending: Radiation Oncology | Admitting: Radiation Oncology

## 2019-08-12 ENCOUNTER — Other Ambulatory Visit: Payer: Self-pay

## 2019-08-12 DIAGNOSIS — Z510 Encounter for antineoplastic radiation therapy: Secondary | ICD-10-CM | POA: Diagnosis not present

## 2019-08-12 DIAGNOSIS — Z170 Estrogen receptor positive status [ER+]: Secondary | ICD-10-CM | POA: Diagnosis not present

## 2019-08-12 DIAGNOSIS — C50411 Malignant neoplasm of upper-outer quadrant of right female breast: Secondary | ICD-10-CM | POA: Diagnosis not present

## 2019-08-13 ENCOUNTER — Ambulatory Visit
Admission: RE | Admit: 2019-08-13 | Discharge: 2019-08-13 | Disposition: A | Discharge status: Home / Self Care | Payer: BC Managed Care – PPO | Source: Ambulatory Visit | Attending: Radiation Oncology | Admitting: Radiation Oncology

## 2019-08-13 ENCOUNTER — Other Ambulatory Visit: Payer: Self-pay

## 2019-08-13 DIAGNOSIS — Z510 Encounter for antineoplastic radiation therapy: Secondary | ICD-10-CM | POA: Diagnosis not present

## 2019-08-13 DIAGNOSIS — C50411 Malignant neoplasm of upper-outer quadrant of right female breast: Secondary | ICD-10-CM | POA: Diagnosis not present

## 2019-08-13 DIAGNOSIS — Z170 Estrogen receptor positive status [ER+]: Secondary | ICD-10-CM | POA: Diagnosis not present

## 2019-08-14 ENCOUNTER — Other Ambulatory Visit: Payer: Self-pay

## 2019-08-14 ENCOUNTER — Ambulatory Visit
Admission: RE | Admit: 2019-08-14 | Discharge: 2019-08-14 | Disposition: A | Discharge status: Home / Self Care | Payer: BC Managed Care – PPO | Source: Ambulatory Visit | Attending: Radiation Oncology | Admitting: Radiation Oncology

## 2019-08-14 DIAGNOSIS — C50411 Malignant neoplasm of upper-outer quadrant of right female breast: Secondary | ICD-10-CM | POA: Diagnosis not present

## 2019-08-14 DIAGNOSIS — Z170 Estrogen receptor positive status [ER+]: Secondary | ICD-10-CM | POA: Diagnosis not present

## 2019-08-14 DIAGNOSIS — Z510 Encounter for antineoplastic radiation therapy: Secondary | ICD-10-CM | POA: Diagnosis not present

## 2019-08-15 ENCOUNTER — Other Ambulatory Visit: Payer: Self-pay

## 2019-08-15 ENCOUNTER — Ambulatory Visit
Admission: RE | Admit: 2019-08-15 | Discharge: 2019-08-15 | Disposition: A | Discharge status: Home / Self Care | Payer: BC Managed Care – PPO | Source: Ambulatory Visit | Attending: Radiation Oncology | Admitting: Radiation Oncology

## 2019-08-15 DIAGNOSIS — Z170 Estrogen receptor positive status [ER+]: Secondary | ICD-10-CM | POA: Diagnosis not present

## 2019-08-15 DIAGNOSIS — C50411 Malignant neoplasm of upper-outer quadrant of right female breast: Secondary | ICD-10-CM | POA: Diagnosis not present

## 2019-08-15 DIAGNOSIS — Z510 Encounter for antineoplastic radiation therapy: Secondary | ICD-10-CM | POA: Diagnosis not present

## 2019-08-18 ENCOUNTER — Ambulatory Visit
Admission: RE | Admit: 2019-08-18 | Discharge: 2019-08-18 | Disposition: A | Discharge status: Home / Self Care | Payer: BC Managed Care – PPO | Source: Ambulatory Visit | Attending: Radiation Oncology | Admitting: Radiation Oncology

## 2019-08-18 ENCOUNTER — Other Ambulatory Visit: Payer: Self-pay

## 2019-08-18 DIAGNOSIS — C50411 Malignant neoplasm of upper-outer quadrant of right female breast: Secondary | ICD-10-CM | POA: Diagnosis not present

## 2019-08-18 DIAGNOSIS — Z170 Estrogen receptor positive status [ER+]: Secondary | ICD-10-CM | POA: Diagnosis not present

## 2019-08-18 DIAGNOSIS — Z510 Encounter for antineoplastic radiation therapy: Secondary | ICD-10-CM | POA: Diagnosis not present

## 2019-08-19 ENCOUNTER — Telehealth: Payer: Self-pay

## 2019-08-19 ENCOUNTER — Other Ambulatory Visit: Payer: Self-pay

## 2019-08-19 ENCOUNTER — Ambulatory Visit
Admission: RE | Admit: 2019-08-19 | Discharge: 2019-08-19 | Disposition: A | Discharge status: Home / Self Care | Payer: BC Managed Care – PPO | Source: Ambulatory Visit | Attending: Radiation Oncology | Admitting: Radiation Oncology

## 2019-08-19 DIAGNOSIS — Z170 Estrogen receptor positive status [ER+]: Secondary | ICD-10-CM | POA: Diagnosis not present

## 2019-08-19 DIAGNOSIS — Z510 Encounter for antineoplastic radiation therapy: Secondary | ICD-10-CM | POA: Diagnosis not present

## 2019-08-19 DIAGNOSIS — C50411 Malignant neoplasm of upper-outer quadrant of right female breast: Secondary | ICD-10-CM | POA: Diagnosis not present

## 2019-08-19 NOTE — Telephone Encounter (Signed)
Called this patient back as someone sent her to my voicemail.  She will be here at 1100 tomorrow instead of 4 pm.  Routing to Dr. Lisbeth Renshaw and Phineas Real to let the therapists know.  I am assuming Threasa Beards, [employer] Therapist called her as I did not call her today.  Gardiner Rhyme, RN

## 2019-08-20 ENCOUNTER — Ambulatory Visit
Admission: RE | Admit: 2019-08-20 | Discharge: 2019-08-20 | Disposition: A | Discharge status: Home / Self Care | Payer: BC Managed Care – PPO | Source: Ambulatory Visit | Attending: Radiation Oncology | Admitting: Radiation Oncology

## 2019-08-20 ENCOUNTER — Other Ambulatory Visit: Payer: Self-pay

## 2019-08-20 DIAGNOSIS — Z170 Estrogen receptor positive status [ER+]: Secondary | ICD-10-CM | POA: Diagnosis not present

## 2019-08-20 DIAGNOSIS — C50411 Malignant neoplasm of upper-outer quadrant of right female breast: Secondary | ICD-10-CM | POA: Diagnosis not present

## 2019-08-20 DIAGNOSIS — Z510 Encounter for antineoplastic radiation therapy: Secondary | ICD-10-CM | POA: Diagnosis not present

## 2019-08-21 ENCOUNTER — Other Ambulatory Visit: Payer: Self-pay

## 2019-08-21 ENCOUNTER — Ambulatory Visit
Admission: RE | Admit: 2019-08-21 | Discharge: 2019-08-21 | Disposition: A | Discharge status: Home / Self Care | Payer: BC Managed Care – PPO | Source: Ambulatory Visit | Attending: Radiation Oncology | Admitting: Radiation Oncology

## 2019-08-21 DIAGNOSIS — C50411 Malignant neoplasm of upper-outer quadrant of right female breast: Secondary | ICD-10-CM | POA: Insufficient documentation

## 2019-08-21 DIAGNOSIS — Z510 Encounter for antineoplastic radiation therapy: Secondary | ICD-10-CM | POA: Diagnosis not present

## 2019-08-21 DIAGNOSIS — Z170 Estrogen receptor positive status [ER+]: Secondary | ICD-10-CM | POA: Insufficient documentation

## 2019-08-22 ENCOUNTER — Ambulatory Visit: Payer: BC Managed Care – PPO | Admitting: Radiation Oncology

## 2019-08-22 ENCOUNTER — Ambulatory Visit
Admission: RE | Admit: 2019-08-22 | Discharge: 2019-08-22 | Disposition: A | Discharge status: Home / Self Care | Payer: BC Managed Care – PPO | Source: Ambulatory Visit | Attending: Radiation Oncology | Admitting: Radiation Oncology

## 2019-08-22 ENCOUNTER — Other Ambulatory Visit: Payer: Self-pay

## 2019-08-22 DIAGNOSIS — Z170 Estrogen receptor positive status [ER+]: Secondary | ICD-10-CM | POA: Diagnosis not present

## 2019-08-22 DIAGNOSIS — Z510 Encounter for antineoplastic radiation therapy: Secondary | ICD-10-CM | POA: Diagnosis not present

## 2019-08-22 DIAGNOSIS — C50411 Malignant neoplasm of upper-outer quadrant of right female breast: Secondary | ICD-10-CM | POA: Diagnosis not present

## 2019-08-25 ENCOUNTER — Other Ambulatory Visit: Payer: Self-pay

## 2019-08-25 ENCOUNTER — Ambulatory Visit
Admission: RE | Admit: 2019-08-25 | Discharge: 2019-08-25 | Disposition: A | Discharge status: Home / Self Care | Payer: BC Managed Care – PPO | Source: Ambulatory Visit | Attending: Radiation Oncology | Admitting: Radiation Oncology

## 2019-08-25 DIAGNOSIS — Z170 Estrogen receptor positive status [ER+]: Secondary | ICD-10-CM | POA: Diagnosis not present

## 2019-08-25 DIAGNOSIS — Z510 Encounter for antineoplastic radiation therapy: Secondary | ICD-10-CM | POA: Diagnosis not present

## 2019-08-25 DIAGNOSIS — C50411 Malignant neoplasm of upper-outer quadrant of right female breast: Secondary | ICD-10-CM | POA: Diagnosis not present

## 2019-08-26 ENCOUNTER — Ambulatory Visit
Admission: RE | Admit: 2019-08-26 | Discharge: 2019-08-26 | Disposition: A | Discharge status: Home / Self Care | Payer: BC Managed Care – PPO | Source: Ambulatory Visit | Attending: Radiation Oncology | Admitting: Radiation Oncology

## 2019-08-26 ENCOUNTER — Other Ambulatory Visit: Payer: Self-pay

## 2019-08-26 DIAGNOSIS — C50411 Malignant neoplasm of upper-outer quadrant of right female breast: Secondary | ICD-10-CM | POA: Diagnosis not present

## 2019-08-26 DIAGNOSIS — Z170 Estrogen receptor positive status [ER+]: Secondary | ICD-10-CM | POA: Diagnosis not present

## 2019-08-26 DIAGNOSIS — Z510 Encounter for antineoplastic radiation therapy: Secondary | ICD-10-CM | POA: Diagnosis not present

## 2019-08-27 ENCOUNTER — Ambulatory Visit
Admission: RE | Admit: 2019-08-27 | Discharge: 2019-08-27 | Disposition: A | Discharge status: Home / Self Care | Payer: BC Managed Care – PPO | Source: Ambulatory Visit | Attending: Radiation Oncology | Admitting: Radiation Oncology

## 2019-08-27 ENCOUNTER — Other Ambulatory Visit: Payer: Self-pay

## 2019-08-27 DIAGNOSIS — Z170 Estrogen receptor positive status [ER+]: Secondary | ICD-10-CM | POA: Diagnosis not present

## 2019-08-27 DIAGNOSIS — Z510 Encounter for antineoplastic radiation therapy: Secondary | ICD-10-CM | POA: Diagnosis not present

## 2019-08-27 DIAGNOSIS — C50411 Malignant neoplasm of upper-outer quadrant of right female breast: Secondary | ICD-10-CM | POA: Diagnosis not present

## 2019-08-28 ENCOUNTER — Ambulatory Visit
Admission: RE | Admit: 2019-08-28 | Discharge: 2019-08-28 | Disposition: A | Discharge status: Home / Self Care | Payer: BC Managed Care – PPO | Source: Ambulatory Visit | Attending: Radiation Oncology | Admitting: Radiation Oncology

## 2019-08-28 ENCOUNTER — Other Ambulatory Visit: Payer: Self-pay

## 2019-08-28 DIAGNOSIS — C50411 Malignant neoplasm of upper-outer quadrant of right female breast: Secondary | ICD-10-CM | POA: Diagnosis not present

## 2019-08-28 DIAGNOSIS — Z170 Estrogen receptor positive status [ER+]: Secondary | ICD-10-CM | POA: Diagnosis not present

## 2019-08-28 DIAGNOSIS — Z510 Encounter for antineoplastic radiation therapy: Secondary | ICD-10-CM | POA: Diagnosis not present

## 2019-08-29 ENCOUNTER — Ambulatory Visit
Admission: RE | Admit: 2019-08-29 | Discharge: 2019-08-29 | Disposition: A | Discharge status: Home / Self Care | Payer: BC Managed Care – PPO | Source: Ambulatory Visit | Attending: Radiation Oncology | Admitting: Radiation Oncology

## 2019-08-29 ENCOUNTER — Other Ambulatory Visit: Payer: Self-pay

## 2019-08-29 DIAGNOSIS — Z170 Estrogen receptor positive status [ER+]: Secondary | ICD-10-CM | POA: Diagnosis not present

## 2019-08-29 DIAGNOSIS — C50411 Malignant neoplasm of upper-outer quadrant of right female breast: Secondary | ICD-10-CM | POA: Diagnosis not present

## 2019-08-29 DIAGNOSIS — Z510 Encounter for antineoplastic radiation therapy: Secondary | ICD-10-CM | POA: Diagnosis not present

## 2019-09-01 ENCOUNTER — Ambulatory Visit: Payer: BC Managed Care – PPO

## 2019-09-01 ENCOUNTER — Encounter: Payer: Self-pay | Admitting: *Deleted

## 2019-09-02 ENCOUNTER — Other Ambulatory Visit: Payer: Self-pay

## 2019-09-02 ENCOUNTER — Encounter: Payer: Self-pay | Admitting: Radiation Oncology

## 2019-09-02 ENCOUNTER — Ambulatory Visit
Admission: RE | Admit: 2019-09-02 | Discharge: 2019-09-02 | Disposition: A | Discharge status: Home / Self Care | Payer: BC Managed Care – PPO | Source: Ambulatory Visit | Attending: Radiation Oncology | Admitting: Radiation Oncology

## 2019-09-02 DIAGNOSIS — Z510 Encounter for antineoplastic radiation therapy: Secondary | ICD-10-CM | POA: Diagnosis not present

## 2019-09-02 DIAGNOSIS — C50411 Malignant neoplasm of upper-outer quadrant of right female breast: Secondary | ICD-10-CM | POA: Diagnosis not present

## 2019-09-02 DIAGNOSIS — Z170 Estrogen receptor positive status [ER+]: Secondary | ICD-10-CM | POA: Diagnosis not present

## 2019-09-18 ENCOUNTER — Other Ambulatory Visit: Payer: Self-pay | Admitting: Radiation Oncology

## 2019-09-18 ENCOUNTER — Telehealth: Payer: Self-pay | Admitting: *Deleted

## 2019-09-18 DIAGNOSIS — C50411 Malignant neoplasm of upper-outer quadrant of right female breast: Secondary | ICD-10-CM

## 2019-09-18 DIAGNOSIS — Z171 Estrogen receptor negative status [ER-]: Secondary | ICD-10-CM

## 2019-09-18 NOTE — Telephone Encounter (Signed)
Spoke with the patient regarding some symptoms she is having.  She states she has been having SOB for a couple of weeks, dry cough, and occasional heart palpitations.  No chest pain, chest pressure, fever, chills, or loss of taste or smell.  She states she normally has to take a deep breath in order to catch her breath and she has some tightness in her upper chest.  She completed radiation therapy to her right breast 09/02/2019.  The PA did not feel that her symptoms are radiation related and wants to explore other possibilities.  Patient was informed that the PA would like to obtain a chest x ray, covid test, and referral to cardiology.  Patient was made aware that our scheduler would be in contact with her to set up the chest xray and the cardiology office would call her when they receive the referral.  She was given the phone number to schedule her covid test.  She was advised to seek medical attention at the nearest emergency department if her symptoms worsened.  She verbalized understanding.  Will continue to follow as necessary.  Gloriajean Dell. Leonie Green, BSN

## 2019-09-18 NOTE — Progress Notes (Signed)
Through nursing triage of a call that came to our clinic, it sounds as though the patient has had subacute progressive shortness of breath and palpitations at rest or with exertion. She was encouraged to go to the ED but is refusing at this time. She has a history of DCIS treated with lumpectomy and recent adjuvant radiotherapy which completed on 09/02/19. Ordered covid 19 testing, stat CXR to rule out pneumonia, and cardiology eval for palpitations. She knows to go to ED if symptoms become acute.

## 2019-09-22 ENCOUNTER — Ambulatory Visit: Payer: BC Managed Care – PPO | Attending: Internal Medicine

## 2019-09-22 DIAGNOSIS — Z20822 Contact with and (suspected) exposure to covid-19: Secondary | ICD-10-CM

## 2019-09-23 ENCOUNTER — Telehealth: Payer: Self-pay | Admitting: Radiation Oncology

## 2019-09-23 ENCOUNTER — Other Ambulatory Visit: Payer: Self-pay

## 2019-09-23 ENCOUNTER — Ambulatory Visit (HOSPITAL_COMMUNITY)
Admission: RE | Admit: 2019-09-23 | Discharge: 2019-09-23 | Disposition: A | Discharge status: Home / Self Care | Payer: BC Managed Care – PPO | Source: Ambulatory Visit | Attending: Radiation Oncology | Admitting: Radiation Oncology

## 2019-09-23 DIAGNOSIS — Z171 Estrogen receptor negative status [ER-]: Secondary | ICD-10-CM | POA: Diagnosis not present

## 2019-09-23 DIAGNOSIS — C50411 Malignant neoplasm of upper-outer quadrant of right female breast: Secondary | ICD-10-CM | POA: Insufficient documentation

## 2019-09-23 DIAGNOSIS — R0602 Shortness of breath: Secondary | ICD-10-CM | POA: Diagnosis not present

## 2019-09-23 LAB — NOVEL CORONAVIRUS, NAA: SARS-CoV-2, NAA: NOT DETECTED

## 2019-09-23 LAB — SARS-COV-2, NAA 2 DAY TAT

## 2019-09-23 NOTE — Telephone Encounter (Signed)
I called the patient back to let her know her CXR was clear, and that we will follow up with her covid testing as well.  I encouraged her to continue to plan to see cardiology but if her symptoms progressed, that she needs to be seen in an ED setting sooner. She is in agreement.

## 2019-09-23 NOTE — Telephone Encounter (Signed)
  Radiation Oncology         [phone]) [phone] ________________________________  Name: Samantha Livingston MRN: [id number]  Date of Service: 09/23/2019  DOB: 23-Sep-1955  Post Treatment Telephone Note  Diagnosis:   ER/PR negative high-grade DCIS of the right breast.  Interval Since Last Radiation: 3 weeks   08/05/19-09/02/19: The right breast was treated to 42.56 Gy in 16 fractions as well as an 8 Gy boost over 4 fractions  Narrative:  The patient was contacted today for routine follow-up. During treatment she did very well with radiotherapy and did not have significant desquamation. She had called last week complaining of increasing shortness of breath that she thought was possibly due to radiotherapy as well as palpitations. She was counseled on a stat CXR, covid testing, and referral to cardiology as she may need further evaluation and monitor. She has not yet gone for the CXR but had her covid test yesterday. She has not been to the ED which was the alternative to have her symptoms evaluated.   Impression/Plan: 1. ER/PR negative high-grade DCIS of the right breast.. The patient has been doing well since completion of radiotherapy. We discussed that we would be happy to continue to follow her as needed, but she will also continue to follow up with Dr. Hinton Rao in medical oncology. She was counseled on skin care as well as measures to avoid sun exposure to this area.  2. Shortness of breath with palpitations. I let the patient know that I was still concerned about her symptoms and do not associate it typically with radiotherapy. For this reason she will proceed with CXR, and she also had her covid testing yesterday which we will follow up on when results are available as she plans to try to go get this done today. She is aware if her symptoms progress she should got to the ED for evaluation. She is also getting set up for cardiology evaluation in a few weeks to rule out arrythmia causing her racing heart  rate.    Carola Rhine, PAC

## 2019-09-23 NOTE — Progress Notes (Signed)
  Radiation Oncology         [phone] ________________________________  Name: Samantha Livingston MRN: [id number]  Date: 09/02/2019  DOB: Feb 22, 1956  End of Treatment Note  Diagnosis:   right-sided breast cancer     Indication for treatment:  Curative       Radiation treatment dates:   08/05/19 - 09/02/19  Site/dose:   The patient initially received a dose of 42.56 Gy in 16 fractions to the breast using whole-breast tangent fields. This was delivered using a 3-D conformal technique. The patient then received a boost to the seroma. This delivered an additional 8 Gy in 54fractions using a 3 field photon technique due to the depth of the seroma. The total dose was 50.56 Gy.  Narrative: The patient tolerated radiation treatment relatively well.   The patient had some expected skin irritation as she progressed during treatment. Moist desquamation was not present at the end of treatment.  Plan: The patient has completed radiation treatment. The patient will return to radiation oncology clinic for routine followup in one month. I advised the patient to call or return sooner if they have any questions or concerns related to their recovery or treatment. ________________________________  Jodelle Gross, M.D., Ph.D.

## 2019-09-24 ENCOUNTER — Telehealth: Payer: Self-pay | Admitting: Radiation Oncology

## 2019-09-24 NOTE — Telephone Encounter (Signed)
I called the patient confirm that she knew her covid results were negative.

## 2019-10-06 NOTE — Progress Notes (Signed)
Cardiology Office Note    Date:  10/07/2019   ID:  Samantha Livingston, DOB [birth year], MRN [id number]  PCP:  System, Pcp Not In  Cardiologist:  Fransico Him, MD   Chief Complaint  Patient presents with  . New Patient (Initial Visit)    SOB and Palpitations    History of Present Illness:  Samantha Livingston is a 64 y.o. female who is being seen today for the evaluation of SOB and palpitations at the request of Hayden Pedro,*.  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  Recently complained of SOB and palpitations and is now referred for evaluation. She tells me that she was fine prior to starting XRt and right after her first XRT treatment she developed SOB that has never gone away.  She says it is there all the time, even when she is in bed and will awaken her at night some.  She denies any chest pain or pressure, LE edema or dizziness.  She does have a hx of what sounds like vasovagal syncope when she is around peopled talking about medical terminology.  She also has been having palpitations that occur on a daily basis throughout the day which feel like her heart if beating fast.  She has no hx of tobacco use and no family hx of heart disease but hx of CVA/Breast CA in her mom and dementia in her father.   Past Medical History:  Diagnosis Date  . 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 Family history of breast cancer   . Family history of melanoma   . Family history of stomach cancer   . Irritable bowel syndrome   . Migraine with aura, without mention of intractable migraine without mention of status migrainosus     Past Surgical History:  Procedure Laterality Date  . APPENDECTOMY  02-23-2006  . BREAST LUMPECTOMY WITH RADIOACTIVE SEED LOCALIZATION Right 07/08/2019   Procedure: RIGHT BREAST LUMPECTOMY WITH RADIOACTIVE SEED LOCALIZATION;  Surgeon: [name], MD;  Location: [facility];  Service: General;   Laterality: Right;  . TONSILLECTOMY  1969  . TUBAL LIGATION  1983  . UPPER GASTROINTESTINAL ENDOSCOPY  1980    Current Medications: Current Meds  Medication Sig  . Multiple Vitamin (MULTIVITAMIN) tablet Take 1 tablet by mouth daily.  . rizatriptan (MAXALT) 10 MG tablet Take one tablet by mouth at onset of migraine headaches. May repeat in 2 hours if needed    Allergies:   Sulfur   Social History   Socioeconomic History  . Marital status: Widowed    Spouse name: Not on file  . Number of children: Not on file  . Years of education: Not on file  . Highest education level: Not on file  Occupational History  . Not on file  Tobacco Use  . Smoking status: Never Smoker  . Smokeless tobacco: Never Used  Substance and Sexual Activity  . Alcohol use: No  . Drug use: No  . Sexual activity: Not on file  Other Topics Concern  . Not on file  Social History Narrative   Husband died 55 after a prolonged illness. Married 20 years. Her 2nd marriage and his 74th.   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   Social Determinants of Health   Financial Resource Strain:   . Difficulty of Paying Living Expenses:   Food Insecurity:   .  Worried About Charity fundraiser in the Last Year:   . Arboriculturist in the Last Year:   Transportation Needs:   . Film/video editor (Medical):   Marland Kitchen Lack of Transportation (Non-Medical):   Physical Activity:   . Days of Exercise per Week:   . Minutes of Exercise per Session:   Stress:   . Feeling of Stress :   Social Connections:   . Frequency of Communication with Friends and Family:   . Frequency of Social Gatherings with Friends and Family:   . Attends Religious Services:   . Active Member of Clubs or Organizations:   . Attends Archivist Meetings:   Marland Kitchen Marital Status:      Family History:  The patient's family history includes Bone cancer in her mother; Breast cancer in her mother; Breast cancer (age of  onset: 53) in an other family member; CVA in her mother; Cancer in her maternal grandfather and mother; Melanoma (age of onset: 63) in her brother; Stomach cancer (age of onset: 35) in her maternal aunt.   ROS:   Please see the history of present illness.    ROS All other systems reviewed and are negative.  No flowsheet data found.     PHYSICAL EXAM:   VS:  BP 136/80   Pulse 63   Ht 5\' 5"  (1.651 m)   Wt 137 lb 9.6 oz (62.4 kg)   BMI 22.90 kg/m    GEN: Well nourished, well developed, in no acute distress  HEENT: normal  Neck: no JVD, carotid bruits, or masses Cardiac: RRR; no murmurs, rubs, or gallops,no edema.  Intact distal pulses bilaterally.  Respiratory:  clear to auscultation bilaterally, normal work of breathing GI: soft, nontender, nondistended, + BS MS: no deformity or atrophy  Skin: warm and dry, no rash Neuro:  Alert and Oriented x 3, Strength and sensation are intact Psych: euthymic mood, full affect  Wt Readings from Last 3 Encounters:  10/07/19 137 lb 9.6 oz (62.4 kg)  07/08/19 140 lb 6.9 oz (63.7 kg)  06/17/19 142 lb (64.4 kg)      Studies/Labs Reviewed:   EKG:  EKG is ordered today.  The ekg ordered today demonstrates NSR with no ST changes  Recent Labs: No results found for requested labs within last 8760 hours.   Lipid Panel    Component Value Date/Time   CHOL 261 (H) 03/27/2014 0831   TRIG 160 (H) 03/27/2014 0831   HDL 62 03/27/2014 0831   CHOLHDL 4.2 03/27/2014 0831   LDLCALC 167 (H) 03/27/2014 0831    Additional studies/ records that were reviewed today include:  Office notes from PA    ASSESSMENT:    1. SOB (shortness of breath)   2. Palpitations      PLAN:  In order of problems listed above:  1. SOB -unclear etiology of symptoms -started right after her first XRT for breast CA and has not resolved since then -cxray was fine -EKG is normal -will get a 2D echo to assess LVF  -ETT to rule out ischemia -coronary Ca++ score  to assess future risk -if the above studies are fine then will refer to pulmonary   2.  Palpitations -unclear etiology but occur daily -will get a 2 week Ziopatch to assess   Medication Adjustments/Labs and Tests Ordered: Current medicines are reviewed at length with the patient today.  Concerns regarding medicines are outlined above.  Medication changes, Labs and Tests ordered today  are listed in the Patient Instructions below.  There are no Patient Instructions on file for this visit.   Signed, Fransico Him, MD  10/07/2019 9:56 AM    [facility] [place], [place], [place]  [address] Phone: [phone]; Fax: [phone]

## 2019-10-07 ENCOUNTER — Other Ambulatory Visit: Payer: Self-pay

## 2019-10-07 ENCOUNTER — Encounter: Payer: Self-pay | Admitting: Cardiology

## 2019-10-07 ENCOUNTER — Ambulatory Visit: Payer: BC Managed Care – PPO | Admitting: Cardiology

## 2019-10-07 ENCOUNTER — Telehealth: Payer: Self-pay | Admitting: Radiology

## 2019-10-07 VITALS — BP 136/80 | HR 63 | Ht 65.0 in | Wt 137.6 lb

## 2019-10-07 DIAGNOSIS — R0602 Shortness of breath: Secondary | ICD-10-CM | POA: Diagnosis not present

## 2019-10-07 DIAGNOSIS — R002 Palpitations: Secondary | ICD-10-CM

## 2019-10-07 NOTE — Progress Notes (Signed)
Thanks so much for the follow up and for seeing her! Bryson Ha

## 2019-10-07 NOTE — Patient Instructions (Signed)
Medication Instructions:  Your physician recommends that you continue on your current medications as directed. Please refer to the Current Medication list given to you today.  *If you need a refill on your cardiac medications before your next appointment, please call your pharmacy*  Testing/Procedures: Your physician has requested that you have an echocardiogram. Echocardiography is a painless test that uses sound waves to create images of your heart. It provides your doctor with information about the size and shape of your heart and how well your heart's chambers and valves are working. This procedure takes approximately one hour. There are no restrictions for this procedure.  Your physician has recommended that you wear an event monitor. Event monitors are medical devices that record the heart's electrical activity. Doctors most often [place] these monitors to diagnose arrhythmias. Arrhythmias are problems with the speed or rhythm of the heartbeat. The monitor is a small, portable device. You can wear one while you do your normal daily activities. This is usually used to diagnose what is causing palpitations/syncope (passing out).  Your physician has requested that you have an exercise tolerance test. For further information please visit [email or web address]. Please also follow instruction sheet, as given.  Your physician has recommended that you have a Calcium CT scan.  Follow-Up: At [facility], you and your health needs are our priority.  As part of our continuing mission to provide you with exceptional heart care, we have created designated Provider Care Teams.  These Care Teams include your primary Cardiologist (physician) and Advanced Practice Providers (APPs -  Physician Assistants and Nurse Practitioners) who all work together to provide you with the care you need, when you need it.  Follow up with Dr. Radford Pax as needed based on results of testing   Other Instructions ZIO XT- Long Term  Monitor Instructions   Your physician has requested you wear your ZIO patch monitor 14 days.   This is a single patch monitor.  Ir[employer] supplies one patch monitor per enrollment.  Additional stickers are not available.   Please do not apply patch if you will be having a Nuclear Stress Test, Echocardiogram, Cardiac CT, MRI, or Chest Xray during the time frame you would be wearing the monitor. The patch cannot be worn during these tests.  You cannot remove and re-apply the ZIO XT patch monitor.   Your ZIO patch monitor will be sent USPS Priority mail from [facility] directly to your home address. The monitor may also be mailed to a PO BOX if home delivery is not available.   It may take 3-5 days to receive your monitor after you have been enrolled.   Once you have received you monitor, please review enclosed instructions.  Your monitor has already been registered assigning a specific monitor serial # to you.   Applying the monitor   Shave hair from upper left chest.   Hold abrader disc by orange tab.  Rub abrader in 40 strokes over left upper chest as indicated in your monitor instructions.   Clean area with 4 enclosed alcohol pads .  Use all pads to assure are is cleaned thoroughly.  Let dry.   Apply patch as indicated in monitor instructions.  Patch will be place under collarbone on left side of chest with arrow pointing upward.   Rub patch adhesive wings for 2 minutes.Remove white label marked "1".  Remove white label marked "2".  Rub patch adhesive wings for 2 additional minutes.   While looking in a mirror, press and  release button in center of patch.  A small green light will flash 3-4 times .  This will be your only indicator the monitor has been turned on.     Do not shower for the first 24 hours.  You may shower after the first 24 hours.   Press button if you feel a symptom. You will hear a small click.  Record Date, Time and Symptom in the Patient Log Book.   When you  are ready to remove patch, follow instructions on last 2 pages of Patient Log Book.  Stick patch monitor onto last page of Patient Log Book.   Place Patient Log Book in [place] box.  Use locking tab on box and tape box closed securely.  The [employer] and [employer] has [employer] on it.  Please place in mailbox as soon as possible.  Your physician should have your test results approximately 7 days after the monitor has been mailed back to [facility].   Call [place] at [phone] if you have questions regarding your ZIO XT patch monitor.  Call them immediately if you see an orange light blinking on your monitor.   If your monitor falls off in less than 4 days contact our Monitor department at [phone].  If your monitor becomes loose or falls off after 4 days call [employer] at [phone] for suggestions on securing your monitor.     Coronary Calcium Scan A coronary calcium scan is an imaging test used to look for deposits of plaque in the inner lining of the blood vessels of the heart (coronary arteries). Plaque is made up of calcium, protein, and fatty substances. These deposits of plaque can partly clog and narrow the coronary arteries without producing any symptoms or warning signs. This puts a person at risk for a heart attack. This test is recommended for people who are at moderate risk for heart disease. The test can find plaque deposits before symptoms develop. Tell a health care provider about:  Any allergies you have.  All medicines you are taking, including vitamins, herbs, eye drops, creams, and over-the-counter medicines.  Any problems you or family members have had with anesthetic medicines.  Any blood disorders you have.  Any surgeries you have had.  Any medical conditions you have.  Whether you are pregnant or may be pregnant. What are the risks? Generally, this is a safe procedure. However, problems may occur, including:  Harm to a pregnant  woman and her unborn baby. This test involves the use of radiation. Radiation exposure can be dangerous to a pregnant woman and her unborn baby. If you are pregnant or think you may be pregnant, you should not have this procedure done.  Slight increase in the risk of cancer. This is because of the radiation involved in the test. What happens before the procedure? Ask your health care provider for any specific instructions on how to prepare for this procedure. You may be asked to avoid products that contain caffeine, tobacco, or nicotine for 4 hours before the procedure. What happens during the procedure?   You will undress and remove any jewelry from your neck or chest.  You will put on a hospital gown.  Sticky electrodes will be placed on your chest. The electrodes will be connected to an electrocardiogram (ECG) machine to record a tracing of the electrical activity of your heart.  You will lie down on a curved bed that is attached to the [place].  You may be  given medicine to slow down your heart rate so that clear pictures can be created.  You will be moved into the CT scanner, and the CT scanner will take pictures of your heart. During this time, you will be asked to lie still and hold your breath for 2-3 seconds at a time while each picture of your heart is being taken. The procedure may vary among health care providers and hospitals. What happens after the procedure?  You can get dressed.  You can return to your normal activities.  It is up to you to get the results of your procedure. Ask your health care provider, or the department that is doing the procedure, when your results will be ready. Summary  A coronary calcium scan is an imaging test used to look for deposits of plaque in the inner lining of the blood vessels of the heart (coronary arteries). Plaque is made up of calcium, protein, and fatty substances.  Generally, this is a safe procedure. Tell your health care  provider if you are pregnant or may be pregnant.  Ask your health care provider for any specific instructions on how to prepare for this procedure.  A CT scanner will take pictures of your heart.  You can return to your normal activities after the scan is done. This information is not intended to replace advice given to you by your health care provider. Make sure you discuss any questions you have with your health care provider. Document Revised: 11/26/2018 Document Reviewed: 11/26/2018 Elsevier Patient Education  [place].

## 2019-10-07 NOTE — Telephone Encounter (Signed)
Enrolled patient for a 14 day Zio monitor to be mailed to patients home.  

## 2019-10-11 ENCOUNTER — Other Ambulatory Visit (INDEPENDENT_AMBULATORY_CARE_PROVIDER_SITE_OTHER): Payer: BC Managed Care – PPO

## 2019-10-11 DIAGNOSIS — R002 Palpitations: Secondary | ICD-10-CM | POA: Diagnosis not present

## 2019-10-11 DIAGNOSIS — R0602 Shortness of breath: Secondary | ICD-10-CM

## 2019-10-31 ENCOUNTER — Other Ambulatory Visit (HOSPITAL_COMMUNITY)
Admission: RE | Admit: 2019-10-31 | Discharge: 2019-10-31 | Disposition: A | Discharge status: Home / Self Care | Payer: BC Managed Care – PPO | Source: Ambulatory Visit | Attending: Cardiology | Admitting: Cardiology

## 2019-10-31 DIAGNOSIS — Z20822 Contact with and (suspected) exposure to covid-19: Secondary | ICD-10-CM | POA: Diagnosis not present

## 2019-10-31 DIAGNOSIS — Z01812 Encounter for preprocedural laboratory examination: Secondary | ICD-10-CM | POA: Diagnosis not present

## 2019-10-31 LAB — SARS CORONAVIRUS 2 (TAT 6-24 HRS): SARS Coronavirus 2: NEGATIVE

## 2019-11-04 ENCOUNTER — Ambulatory Visit (INDEPENDENT_AMBULATORY_CARE_PROVIDER_SITE_OTHER): Payer: BC Managed Care – PPO

## 2019-11-04 ENCOUNTER — Ambulatory Visit (INDEPENDENT_AMBULATORY_CARE_PROVIDER_SITE_OTHER)
Admission: RE | Admit: 2019-11-04 | Discharge: 2019-11-04 | Disposition: A | Discharge status: Home / Self Care | Payer: Self-pay | Source: Ambulatory Visit | Attending: Cardiology | Admitting: Cardiology

## 2019-11-04 ENCOUNTER — Other Ambulatory Visit: Payer: Self-pay

## 2019-11-04 ENCOUNTER — Ambulatory Visit (HOSPITAL_COMMUNITY): Payer: BC Managed Care – PPO | Attending: Cardiovascular Disease

## 2019-11-04 DIAGNOSIS — R0602 Shortness of breath: Secondary | ICD-10-CM

## 2019-11-04 DIAGNOSIS — R918 Other nonspecific abnormal finding of lung field: Secondary | ICD-10-CM | POA: Diagnosis not present

## 2019-11-04 DIAGNOSIS — R002 Palpitations: Secondary | ICD-10-CM | POA: Insufficient documentation

## 2019-11-04 LAB — EXERCISE TOLERANCE TEST
Estimated workload: 10.1 METS
Exercise duration (min): 8 min
Exercise duration (sec): 0 s
MPHR: 159 {beats}/min
Peak HR: 150 {beats}/min
Percent HR: 94 %
RPE: 15
Rest HR: 68 {beats}/min

## 2019-11-05 ENCOUNTER — Telehealth: Payer: Self-pay

## 2019-11-05 DIAGNOSIS — R0602 Shortness of breath: Secondary | ICD-10-CM

## 2019-11-05 NOTE — Telephone Encounter (Signed)
Referral placed to pulmonology

## 2019-11-05 NOTE — Telephone Encounter (Signed)
-----   Message from Sueanne Margarita, MD sent at 11/05/2019  1:00 PM EDT ----- Refer to Dr. Chase Caller with Pulmonary

## 2019-11-13 DIAGNOSIS — R002 Palpitations: Secondary | ICD-10-CM | POA: Diagnosis not present

## 2019-11-14 ENCOUNTER — Telehealth: Payer: Self-pay

## 2019-11-14 DIAGNOSIS — R002 Palpitations: Secondary | ICD-10-CM

## 2019-11-14 NOTE — Telephone Encounter (Signed)
-----   Message from Sueanne Margarita, MD sent at 11/14/2019 11:34 AM EDT ----- Heart monitor showed extra heart beats from the top and bottom of her heart which are benign.  Please have her come in for BMET, TSH and Mag.  Please find out if she wants to try meds to suppress palpitations

## 2019-11-14 NOTE — Telephone Encounter (Signed)
The patient has been notified of the result and verbalized understanding.  All questions (if any) were answered. Patient states that she does not want to try medication at this time. She will call back if palpitations become bothersome and she changes her mind.

## 2019-11-25 ENCOUNTER — Other Ambulatory Visit: Payer: BC Managed Care – PPO

## 2019-11-25 ENCOUNTER — Other Ambulatory Visit: Payer: Self-pay

## 2019-11-25 DIAGNOSIS — R002 Palpitations: Secondary | ICD-10-CM | POA: Diagnosis not present

## 2019-11-25 LAB — MAGNESIUM: Magnesium: 2.1 mg/dL (ref 1.6–2.3)

## 2019-11-25 LAB — BASIC METABOLIC PANEL
BUN/Creatinine Ratio: 11 — ABNORMAL LOW (ref 12–28)
BUN: 9 mg/dL (ref 8–27)
CO2: 23 mmol/L (ref 20–29)
Calcium: 9.1 mg/dL (ref 8.7–10.3)
Chloride: 102 mmol/L (ref 96–106)
Creatinine, Ser: 0.85 mg/dL (ref 0.57–1.00)
GFR calc Af Amer: 86 mL/min/{1.73_m2} (ref >59)
GFR calc non Af Amer: 74 mL/min/{1.73_m2} (ref >59)
Glucose: 84 mg/dL (ref 65–99)
Potassium: 4.4 mmol/L (ref 3.5–5.2)
Sodium: 139 mmol/L (ref 134–144)

## 2019-11-25 LAB — TSH: TSH: 2.42 u[IU]/mL (ref 0.450–4.500)

## 2019-12-18 ENCOUNTER — Other Ambulatory Visit: Payer: Self-pay

## 2019-12-18 ENCOUNTER — Encounter: Payer: Self-pay | Admitting: Pulmonary Disease

## 2019-12-18 ENCOUNTER — Ambulatory Visit: Payer: BC Managed Care – PPO | Admitting: Pulmonary Disease

## 2019-12-18 VITALS — BP 120/72 | HR 68 | Temp 98.2°F | Ht 65.0 in | Wt 137.0 lb

## 2019-12-18 DIAGNOSIS — R0602 Shortness of breath: Secondary | ICD-10-CM

## 2019-12-18 LAB — CBC WITH DIFFERENTIAL/PLATELET
Basophils Absolute: 0 10*3/uL (ref 0.0–0.1)
Basophils Relative: 0.5 % (ref 0.0–3.0)
Eosinophils Absolute: 0.1 10*3/uL (ref 0.0–0.7)
Eosinophils Relative: 1.7 % (ref 0.0–5.0)
HCT: 42.5 % (ref 36.0–46.0)
Hemoglobin: 14.2 g/dL (ref 12.0–15.0)
Lymphocytes Relative: 35.1 % (ref 12.0–46.0)
Lymphs Abs: 2.5 10*3/uL (ref 0.7–4.0)
MCHC: 33.4 g/dL (ref 30.0–36.0)
MCV: 90.2 fl (ref 78.0–100.0)
Monocytes Absolute: 0.4 10*3/uL (ref 0.1–1.0)
Monocytes Relative: 5.3 % (ref 3.0–12.0)
Neutro Abs: 4.1 10*3/uL (ref 1.4–7.7)
Neutrophils Relative %: 57.4 % (ref 43.0–77.0)
Platelets: 242 10*3/uL (ref 150.0–400.0)
RBC: 4.72 Mil/uL (ref 3.87–5.11)
RDW: 12.9 % (ref 11.5–15.5)
WBC: 7.2 10*3/uL (ref 4.0–10.5)

## 2019-12-18 MED ORDER — PANTOPRAZOLE SODIUM 40 MG PO TBEC
40.0000 mg | DELAYED_RELEASE_TABLET | Freq: Every day | ORAL | 5 refills | Status: DC
Start: 2019-12-18 — End: 2021-09-06

## 2019-12-18 NOTE — Progress Notes (Signed)
Samantha Livingston    [id number]    [birth year]  Primary Care Physician:Patient, No Pcp Per  Referring Physician: Sueanne Margarita, MD [address] [place],  [place] [address]  Chief complaint: Consult for dyspnea  HPI: 64 year old with breast cancer, migraine headaches Complains of dyspnea which started during her first radiation dose for breast cancer in March 2021.  It felt like an anxiety attack Describes sensations of increasing dyspnea when laying down, inability to take deep breaths, atypical chest tightness with palpitations She has been evaluated by cardiology with negative CT coronaries stress test and echocardiogram.  Pets: No pets Occupation: Works as a Cabin crew and an Clinical cytogeneticist Exposures: No known exposures.  No mold, hot tub, Jacuzzi.  No down pillows or comforters Smoking history: Never smoker Travel history: No significant travel history Relevant family history: No significant family issue of lung disease   Outpatient Encounter Medications as of 12/18/2019  Medication Sig  . Multiple Vitamin (MULTIVITAMIN) tablet Take 1 tablet by mouth daily.  . rizatriptan (MAXALT) 10 MG tablet Take one tablet by mouth at onset of migraine headaches. May repeat in 2 hours if needed   No facility-administered encounter medications on file as of 12/18/2019.    Allergies as of 12/18/2019 - Review Complete 12/18/2019  Allergen Reaction Noted  . Sulfur Rash 09/06/2016    Past Medical History:  Diagnosis Date  . 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 Family history of breast cancer   . Family history of melanoma   . Family history of stomach cancer   . Irritable bowel syndrome   . Migraine with aura, without mention of intractable migraine without mention of status migrainosus     Past Surgical History:  Procedure Laterality Date  . APPENDECTOMY  02-23-2006  . BREAST LUMPECTOMY WITH RADIOACTIVE SEED LOCALIZATION Right 07/08/2019   Procedure: RIGHT BREAST LUMPECTOMY  WITH RADIOACTIVE SEED LOCALIZATION;  Surgeon: [name], MD;  Location: [facility];  Service: General;  Laterality: Right;  . TONSILLECTOMY  1969  . TUBAL LIGATION  1983  . UPPER GASTROINTESTINAL ENDOSCOPY  1980    Family History  Problem Relation Age of Onset  . Breast cancer Mother        2009  . Bone cancer Mother        05/2013  . Cancer Mother        Breast with metastatic disease to bone  . CVA Mother   . Melanoma Brother 53  . Stomach cancer Maternal Aunt 66  . Cancer Maternal Grandfather        Throat  . Breast cancer Other 44       MGF's sister    Social History   Socioeconomic History  . Marital status: Widowed    Spouse name: Not on file  . Number of children: Not on file  . Years of education: Not on file  . Highest education level: Not on file  Occupational History  . Not on file  Tobacco Use  . Smoking status: Never Smoker  . Smokeless tobacco: Never Used  Vaping Use  . Vaping Use: Never used  Substance and Sexual Activity  . Alcohol use: No  . Drug use: No  . Sexual activity: Not on file  Other Topics Concern  . Not on file  Social History Narrative   Husband died 44 after a prolonged illness. Married 20 years. Her 2nd marriage and his 8th.  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   Social Determinants of Health   Financial Resource Strain:   . Difficulty of Paying Living Expenses:   Food Insecurity:   . Worried About Charity fundraiser in the Last Year:   . Arboriculturist in the Last Year:   Transportation Needs:   . Film/video editor (Medical):   Marland Kitchen Lack of Transportation (Non-Medical):   Physical Activity:   . Days of Exercise per Week:   . Minutes of Exercise per Session:   Stress:   . Feeling of Stress :   Social Connections:   . Frequency of Communication with Friends and Family:   . Frequency of Social Gatherings with Friends and Family:   . Attends Religious Services:     . Active Member of Clubs or Organizations:   . Attends Archivist Meetings:   Marland Kitchen Marital Status:   Intimate Partner Violence:   . Fear of Current or Ex-Partner:   . Emotionally Abused:   Marland Kitchen Physically Abused:   . Sexually Abused:     Review of systems: Review of Systems  Constitutional: Negative for fever and chills.  HENT: Negative.   Eyes: Negative for blurred vision.  Respiratory: as per HPI  Cardiovascular: Negative for chest pain and palpitations.  Gastrointestinal: Negative for vomiting, diarrhea, blood per rectum. Genitourinary: Negative for dysuria, urgency, frequency and hematuria.  Musculoskeletal: Negative for myalgias, back pain and joint pain.  Skin: Negative for itching and rash.  Neurological: Negative for dizziness, tremors, focal weakness, seizures and loss of consciousness.  Endo/Heme/Allergies: Negative for environmental allergies.  Psychiatric/Behavioral: Negative for depression, suicidal ideas and hallucinations.  All other systems reviewed and are negative.  Physical Exam: Blood pressure 120/72, pulse 68, temperature 98.2 F (36.8 C), temperature source Oral, height 5\' 5"  (1.651 m), weight 137 lb (62.1 kg), SpO2 99 %. Gen:      No acute distress HEENT:  EOMI, sclera anicteric Neck:     No masses; no thyromegaly Lungs:    Clear to auscultation bilaterally; normal respiratory effort CV:         Regular rate and rhythm; no murmurs Abd:      + bowel sounds; soft, non-tender; no palpable masses, no distension Ext:    No edema; adequate peripheral perfusion Skin:      Warm and dry; no rash Neuro: alert and oriented x 3 Psych: normal mood and affect  Data Reviewed: Imaging: Cardiac CT 11/04/2019-coronary calcium score 0 Visualized lungs are show subcentimeter lung nodules, no interstitial lung process.  I have reviewed the images personally  PFTs:  Labs: CBC 11/15/2023-WBC 7.5, eos 0.2%, absolute eosinophil full count  150  Cardiac: Echocardiogram 11/04/2019 LVEF 60 to 65%, normal RV systolic function, mild MR  Exercise stress test 11/04/2019 No ischemia, low risk study  Assessment:  Dyspnea Unclear etiology.  Her presentation is atypical.  This started during her first radiation treatment and is unlikely secondary to radiation pneumonitis.  Besides her cardiac CT shows no significant lung issues except for benign looking pulmonary nodules Could have anxiety or GERD [as symptoms occur while lying down], rule out airway disease such as asthma  Check CBC differential, respiratory allergy profile Schedule pulmonary function test Start empiric treatment with Protonix 40 mg a day  Plan/Recommendations: CBC, respiratory allergy profile PFTs Protonix 40 mg a day  Marshell Garfinkel MD [facility] 12/18/2019, 8:52 AM  CC: Sueanne Margarita,  MD   

## 2019-12-18 NOTE — Patient Instructions (Signed)
We will check CBC differential, respiratory allergy profile Start Protonix 40 mg a day at night Schedule pulmonary function test  Follow-up in 1 to 2 months after PFTs.

## 2019-12-19 ENCOUNTER — Encounter: Payer: Self-pay | Admitting: Pulmonary Disease

## 2019-12-19 LAB — RESPIRATORY ALLERGY PROFILE REGION II ~~LOC~~

## 2019-12-19 LAB — INTERPRETATION:

## 2019-12-23 ENCOUNTER — Telehealth: Payer: Self-pay | Admitting: Pulmonary Disease

## 2019-12-23 NOTE — Telephone Encounter (Signed)
PA request was received from (pharmacy): [facility] Phone:[phone]  Medication name and strength: Pantoprazole sodium 40 mg DR tablets Ordering Provider: Mannam   Was PA started with CMM?: yes If yes, please enter KEY: [id number] Medication tried and failed: none Covered Alternatives: over the counter proton pump inhibitors.  PA sent to plan, time frame for approval / denial: 72 hours Routing to [place] for follow-up

## 2019-12-24 NOTE — Telephone Encounter (Signed)
Checked status of PA, still under review.

## 2019-12-26 ENCOUNTER — Telehealth: Payer: Self-pay | Admitting: Pulmonary Disease

## 2019-12-29 NOTE — Telephone Encounter (Signed)
Pantoprazole PA denied 12/24/19, wrong diagnosis code used.  Resubmitted Pantoprazole PA with correct GERD diagnosis code. Will follow up.

## 2019-12-30 NOTE — Telephone Encounter (Signed)
Spoke with the pt and notified per other phone note dated 12/26/19- try prilosec otc 20 mg daily

## 2019-12-30 NOTE — Telephone Encounter (Signed)
Please have her try prilosec otc 20 mg once daily

## 2019-12-30 NOTE — Telephone Encounter (Signed)
I spoke with the pt and notified of recs per Dr Vaughan Browner and she verbalized understanding.

## 2020-01-10 ENCOUNTER — Other Ambulatory Visit (HOSPITAL_COMMUNITY): Payer: BC Managed Care – PPO

## 2020-02-13 ENCOUNTER — Ambulatory Visit: Payer: BC Managed Care – PPO | Admitting: Pulmonary Disease

## 2020-03-08 ENCOUNTER — Encounter (HOSPITAL_COMMUNITY): Payer: Self-pay

## 2020-03-11 ENCOUNTER — Other Ambulatory Visit: Payer: Self-pay | Admitting: Obstetrics & Gynecology

## 2020-03-11 DIAGNOSIS — Z1231 Encounter for screening mammogram for malignant neoplasm of breast: Secondary | ICD-10-CM

## 2020-03-12 ENCOUNTER — Other Ambulatory Visit: Payer: Self-pay | Admitting: Obstetrics & Gynecology

## 2020-03-12 DIAGNOSIS — Z9889 Other specified postprocedural states: Secondary | ICD-10-CM

## 2020-03-15 DIAGNOSIS — C44729 Squamous cell carcinoma of skin of left lower limb, including hip: Secondary | ICD-10-CM | POA: Diagnosis not present

## 2020-03-15 DIAGNOSIS — L814 Other melanin hyperpigmentation: Secondary | ICD-10-CM | POA: Diagnosis not present

## 2020-03-15 DIAGNOSIS — L570 Actinic keratosis: Secondary | ICD-10-CM | POA: Diagnosis not present

## 2020-03-15 DIAGNOSIS — D485 Neoplasm of uncertain behavior of skin: Secondary | ICD-10-CM | POA: Diagnosis not present

## 2020-03-15 DIAGNOSIS — D225 Melanocytic nevi of trunk: Secondary | ICD-10-CM | POA: Diagnosis not present

## 2020-03-15 DIAGNOSIS — L821 Other seborrheic keratosis: Secondary | ICD-10-CM | POA: Diagnosis not present

## 2020-03-15 DIAGNOSIS — D1801 Hemangioma of skin and subcutaneous tissue: Secondary | ICD-10-CM | POA: Diagnosis not present

## 2020-04-29 ENCOUNTER — Other Ambulatory Visit: Payer: Self-pay

## 2020-04-29 ENCOUNTER — Ambulatory Visit
Admission: RE | Admit: 2020-04-29 | Discharge: 2020-04-29 | Disposition: A | Discharge status: Home / Self Care | Payer: BC Managed Care – PPO | Source: Ambulatory Visit | Attending: Obstetrics & Gynecology | Admitting: Obstetrics & Gynecology

## 2020-04-29 DIAGNOSIS — Z9889 Other specified postprocedural states: Secondary | ICD-10-CM

## 2020-04-29 DIAGNOSIS — Z853 Personal history of malignant neoplasm of breast: Secondary | ICD-10-CM | POA: Diagnosis not present

## 2020-04-29 HISTORY — DX: Malignant neoplasm of unspecified site of unspecified female breast: C50.919

## 2020-05-03 DIAGNOSIS — Z01419 Encounter for gynecological examination (general) (routine) without abnormal findings: Secondary | ICD-10-CM | POA: Diagnosis not present

## 2020-05-03 DIAGNOSIS — Z6823 Body mass index (BMI) 23.0-23.9, adult: Secondary | ICD-10-CM | POA: Diagnosis not present

## 2020-05-03 DIAGNOSIS — Z0000 Encounter for general adult medical examination without abnormal findings: Secondary | ICD-10-CM | POA: Diagnosis not present

## 2020-05-11 DIAGNOSIS — Z1211 Encounter for screening for malignant neoplasm of colon: Secondary | ICD-10-CM | POA: Diagnosis not present

## 2020-05-19 LAB — EXTERNAL GENERIC LAB PROCEDURE: COLOGUARD: NEGATIVE

## 2020-05-19 LAB — COLOGUARD: COLOGUARD: NEGATIVE

## 2020-06-18 DIAGNOSIS — E7800 Pure hypercholesterolemia, unspecified: Secondary | ICD-10-CM | POA: Diagnosis not present

## 2020-07-20 DIAGNOSIS — D0511 Intraductal carcinoma in situ of right breast: Secondary | ICD-10-CM | POA: Diagnosis not present

## 2021-03-28 ENCOUNTER — Other Ambulatory Visit: Payer: Self-pay | Admitting: Obstetrics & Gynecology

## 2021-03-28 DIAGNOSIS — Z9889 Other specified postprocedural states: Secondary | ICD-10-CM

## 2021-04-27 ENCOUNTER — Ambulatory Visit: Payer: BC Managed Care – PPO | Admitting: Cardiology

## 2021-05-04 ENCOUNTER — Ambulatory Visit
Admission: RE | Admit: 2021-05-04 | Discharge: 2021-05-04 | Disposition: A | Discharge status: Home / Self Care | Payer: BC Managed Care – PPO | Source: Ambulatory Visit | Attending: Obstetrics & Gynecology | Admitting: Obstetrics & Gynecology

## 2021-05-04 DIAGNOSIS — Z9889 Other specified postprocedural states: Secondary | ICD-10-CM

## 2021-05-04 DIAGNOSIS — Z853 Personal history of malignant neoplasm of breast: Secondary | ICD-10-CM | POA: Diagnosis not present

## 2021-05-04 DIAGNOSIS — R922 Inconclusive mammogram: Secondary | ICD-10-CM | POA: Diagnosis not present

## 2021-05-11 DIAGNOSIS — Z124 Encounter for screening for malignant neoplasm of cervix: Secondary | ICD-10-CM | POA: Diagnosis not present

## 2021-05-11 DIAGNOSIS — Z0000 Encounter for general adult medical examination without abnormal findings: Secondary | ICD-10-CM | POA: Diagnosis not present

## 2021-05-11 DIAGNOSIS — Z6823 Body mass index (BMI) 23.0-23.9, adult: Secondary | ICD-10-CM | POA: Diagnosis not present

## 2021-05-11 DIAGNOSIS — Z01419 Encounter for gynecological examination (general) (routine) without abnormal findings: Secondary | ICD-10-CM | POA: Diagnosis not present

## 2021-06-06 ENCOUNTER — Encounter: Payer: Self-pay | Admitting: Cardiology

## 2021-06-06 ENCOUNTER — Telehealth (INDEPENDENT_AMBULATORY_CARE_PROVIDER_SITE_OTHER): Payer: BC Managed Care – PPO | Admitting: Cardiology

## 2021-06-06 ENCOUNTER — Other Ambulatory Visit: Payer: Self-pay

## 2021-06-06 VITALS — BP 109/68 | HR 80 | Ht 65.0 in | Wt 140.0 lb

## 2021-06-06 DIAGNOSIS — R002 Palpitations: Secondary | ICD-10-CM

## 2021-06-06 DIAGNOSIS — I471 Supraventricular tachycardia: Secondary | ICD-10-CM

## 2021-06-06 DIAGNOSIS — R0602 Shortness of breath: Secondary | ICD-10-CM

## 2021-06-06 DIAGNOSIS — I493 Ventricular premature depolarization: Secondary | ICD-10-CM | POA: Diagnosis not present

## 2021-06-06 DIAGNOSIS — E782 Mixed hyperlipidemia: Secondary | ICD-10-CM

## 2021-06-06 NOTE — Patient Instructions (Signed)
Medication Instructions:  Your physician recommends that you continue on your current medications as directed. Please refer to the Current Medication list given to you today.  *If you need a refill on your cardiac medications before your next appointment, please call your pharmacy*   Lab Work: Fasting Lipid Panel and ALT in 3 weeks (Scheduled for 2/7)  If you have labs (blood work) drawn today and your tests are completely normal, you will receive your results only by: [place] (if you have MyChart) OR A paper copy in the mail If you have any lab test that is abnormal or we need to change your treatment, we will call you to review the results.   Testing/Procedures: NONE   Follow-Up: At [facility], you and your health needs are our priority.  As part of our continuing mission to provide you with exceptional heart care, we have created designated Provider Care Teams.  These Care Teams include your primary Cardiologist (physician) and Advanced Practice Providers (APPs -  Physician Assistants and Nurse Practitioners) who all work together to provide you with the care you need, when you need it.   Your next appointment:   1 year(s)  The format for your next appointment:   In Person  Provider:   Fransico Him, MD     Other Instructions: PCP hotline: [phone] - Please call and they will set you up with a primary care doctor. Please send [place] a message when you need a refill on your Crestor.

## 2021-06-06 NOTE — Progress Notes (Signed)
Virtual Visit via Video Note   This visit type was conducted due to national recommendations for restrictions regarding the COVID-19 Pandemic (e.g. social distancing) in an effort to limit this patient's exposure and mitigate transmission in our community.  Due to her co-morbid illnesses, this patient is at least at moderate risk for complications without adequate follow up.  This format is felt to be most appropriate for this patient at this time.  All issues noted in this document were discussed and addressed.  A limited physical exam was performed with this format.  Please refer to the patient's chart for her consent to telehealth for [facility].   Date:  06/06/2021   ID:  LEVETTE PAULICK, DOB [birth year], MRN [id number] The patient was identified using 2 identifiers.  Patient Location: Home Provider Location: Home Office   PCP:  Patient, No Pcp Per (Inactive)   [facility] Providers Cardiologist:  Fransico Him, MD     Evaluation Performed:  Follow-Up Visit  Chief Complaint:  SOB  History of Present Illness:    Samantha Livingston is a 66 y.o. female with a history of migraines and breast cancer.  She was seen by me almost a year ago with shortness of breath and palpitations.  She was fine before starting XRT for her breast cancer and her symptoms started right after her first treatment for XRT.  2D echo 10/2019 showed normal LV function with EF 60 to 65% with normal diastolic function and mild MR.Exercise treadmill showed no ischemia.Event monitor showed normal sinus rhythm with occasional PVCs, ventricular couplets, PACs and nonsustained atrial tachycardia up to 8 beats.  Electrolytes including magnesium and potassium were normal at that time and TSH was normal.  She did not want to go on any medications to suppress her arrhythmias.  Coronary Ca score was also done which was 0.  She is here today for followup and is doing well.  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  Her sister came over and says that she was pale and speech was slurred.  She says that her heart was beating hard.  She says that it was very hot that day and thinks it could have been related to the heat.  She had another episode while mowing the yard out in the heat.  It was very hot that day and as soon as she got into the cool house her sx resolved.  She denies any chest pain or pressure, SOB, DOE, PND, orthopnea, LE edema, palpitations or syncope.  She is compliant with her meds and is tolerating meds with no SE.     The patient does not have symptoms concerning for COVID-19 infection (fever, chills, cough, or new shortness of breath).    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   Breast cancer ([place])    Family history of breast cancer    Family history of melanoma    Family history of stomach cancer    Irritable bowel syndrome    Migraine with aura, without mention of intractable migraine without mention of status migrainosus    PAT (paroxysmal atrial tachycardia) ([place])    Noted on event monitor 09/2019   PVC's (premature ventricular contractions)    Noted on event monitor 09/2019   Past Surgical History:  Procedure Laterality  Date   APPENDECTOMY  02-23-2006   BREAST LUMPECTOMY     BREAST LUMPECTOMY WITH RADIOACTIVE SEED LOCALIZATION Right 07/08/2019   Procedure: RIGHT BREAST LUMPECTOMY WITH RADIOACTIVE SEED LOCALIZATION;  Surgeon: [name], MD;  Location: [place];  Service: General;  Laterality: Right;   [place] ENDOSCOPY  1980     Current Meds  Medication Sig   Multiple Vitamin (MULTIVITAMIN) tablet Take 1 tablet by mouth daily.   rizatriptan (MAXALT) 10 MG tablet Take one tablet by mouth at onset of migraine headaches. May repeat in 2 hours if needed    rosuvastatin (CRESTOR) 20 MG tablet rosuvastatin 20 mg tablet  TAKE 1 TABLET BY MOUTH EVERYDAY AT BEDTIME     Allergies:   Elemental sulfur   Social History   Tobacco Use   Smoking status: Never   Smokeless tobacco: Never  Vaping Use   Vaping Use: Never used  Substance Use Topics   Alcohol use: No   Drug use: No     Family Hx: The patient's family history includes Bone cancer in her mother; Breast cancer in her mother; Breast cancer (age of onset: 58) in an other family member; CVA in her mother; Cancer in her maternal grandfather and mother; Melanoma (age of onset: 64) in her brother; Stomach cancer (age of onset: 20) in her maternal aunt.  ROS:   Please see the history of present illness.     All other systems reviewed and are negative.   Prior CV studies:   The following studies were reviewed today:  2D echo, event monitor, ETT  Labs/Other Tests and Data Reviewed:    EKG:  No ECG reviewed.  Recent Labs: No results found for requested labs within last 8760 hours.   Recent Lipid Panel Lab Results  Component Value Date/Time   CHOL 261 (H) 03/27/2014 08:31 AM   TRIG 160 (H) 03/27/2014 08:31 AM   HDL 62 03/27/2014 08:31 AM   CHOLHDL 4.2 03/27/2014 08:31 AM   LDLCALC 167 (H) 03/27/2014 08:31 AM    Wt Readings from Last 3 Encounters:  06/06/21 140 lb (63.5 kg)  12/18/19 137 lb (62.1 kg)  10/07/19 137 lb 9.6 oz (62.4 kg)     Risk Assessment/Calculations:          Objective:    Vital Signs:  BP 109/68    Pulse 80    Ht 5\' 5"  (1.651 m)    Wt 140 lb (63.5 kg)    SpO2 96%    BMI 23.30 kg/m    VITAL SIGNS:  reviewed GEN:  no acute distress EYES:  sclerae anicteric, EOMI - Extraocular Movements Intact RESPIRATORY:  normal respiratory effort, symmetric expansion CARDIOVASCULAR:  no peripheral edema SKIN:  no rash, lesions or ulcers. MUSCULOSKELETAL:  no obvious deformities. NEURO:  alert and oriented x 3, no obvious focal deficit PSYCH:  normal  affect  ASSESSMENT & PLAN:    Shortness of breath -This occurred after her first treatment of XRT -2D echocardiogram showed normal LV function with EF 60 to 65% and normal diastolic function -Exercise test showed no ischemia and coronary Ca score was 0  2.  Palpitations/PVCs/nonsustained atrial tachycardia -Event monitor showed occasional PVCs, bigeminal PVCs and nonsustained atrial tachycardia up to 8 beats in a row -Labs including potassium magnesium and TSH were normal -Normal LV function on echo -She did not want to go on any type of beta-blocker or  CCB to suppress these -Encouraged her to avoid alcohol and caffeine  3.  Presyncope -this occurred in the setting of working outside in the extreme heat and sounds orthostatic likely related to dehydration -I encouraged her to stay hydrated and avoid working out in the heat  4.  HLD -her LDL was in the 160's in 2015 and recently had her lipids repeated recently by her GYN and was elevated and placed on Crestor 20mg  daily -I will have her get an FLP and ALT in 3 weeks (she has only been on Crestor for 3 weeks)   COVID-19 Education: The signs and symptoms of COVID-19 were discussed with the patient and how to seek care for testing (follow up with PCP or arrange E-visit).  The importance of social distancing was discussed today.  Time:   Today, I have spent 20 minutes with the patient with telehealth technology discussing the above problems.     Medication Adjustments/Labs and Tests Ordered: Current medicines are reviewed at length with the patient today.  Concerns regarding medicines are outlined above.   Tests Ordered: No orders of the defined types were placed in this encounter.   Medication Changes: No orders of the defined types were placed in this encounter.   Follow Up:  In Person in 1 year(s)  Signed, Fransico Him, MD  06/06/2021 8:25 AM    [place]

## 2021-06-06 NOTE — Addendum Note (Signed)
Addended by: Ma Hillock on: 06/06/2021 09:07 AM   Modules accepted: Orders

## 2021-06-09 DIAGNOSIS — E7800 Pure hypercholesterolemia, unspecified: Secondary | ICD-10-CM | POA: Diagnosis not present

## 2021-06-23 ENCOUNTER — Encounter: Payer: Self-pay | Admitting: Cardiology

## 2021-06-28 ENCOUNTER — Other Ambulatory Visit: Payer: BC Managed Care – PPO

## 2021-07-29 IMAGING — MG MM PLC BREAST LOC DEV 1ST LESION INC*R*
7 series · 7 of 7 positions shown · non-contrast
Comparison: Previous exam(s).

CLINICAL DATA: Biopsy proven high-grade ductal carcinoma in-situ.

EXAM:
MAMMOGRAPHIC GUIDED RADIOACTIVE SEED LOCALIZATION OF THE RIGHT
BREAST

[R CC (1 of 4)]
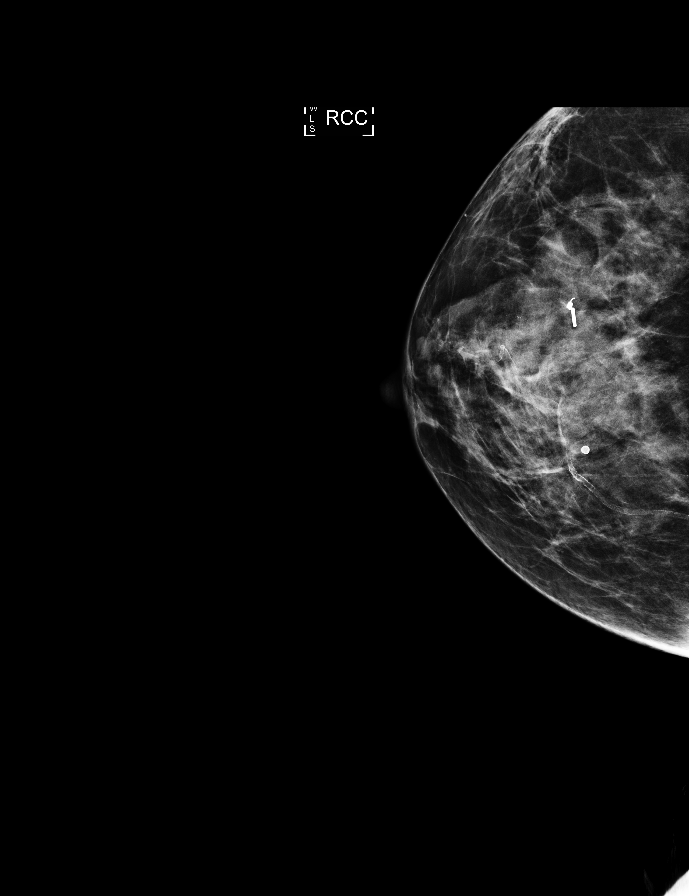

[R LM (1 of 3)]
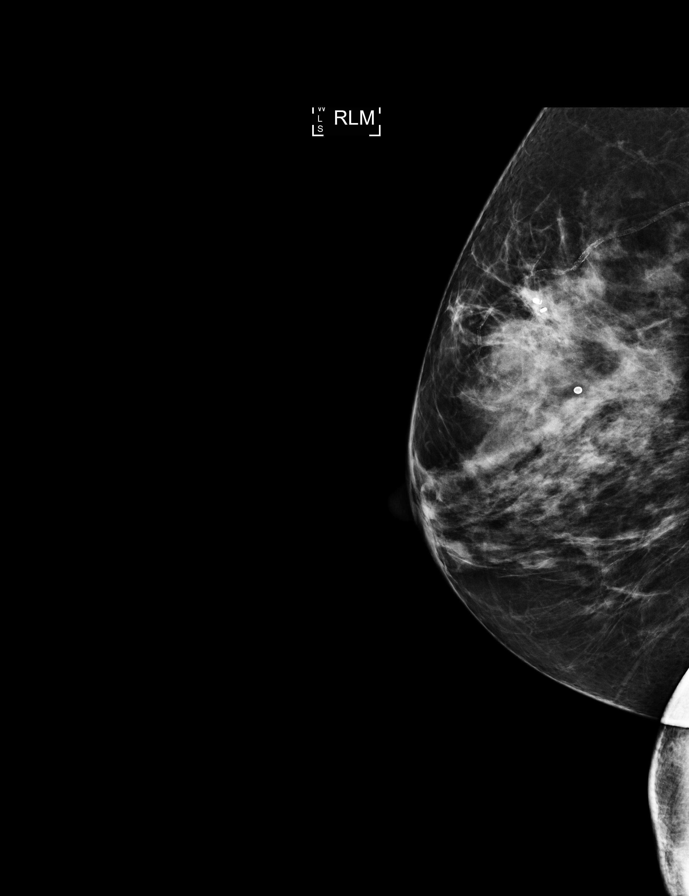

[R CC (2 of 4)]
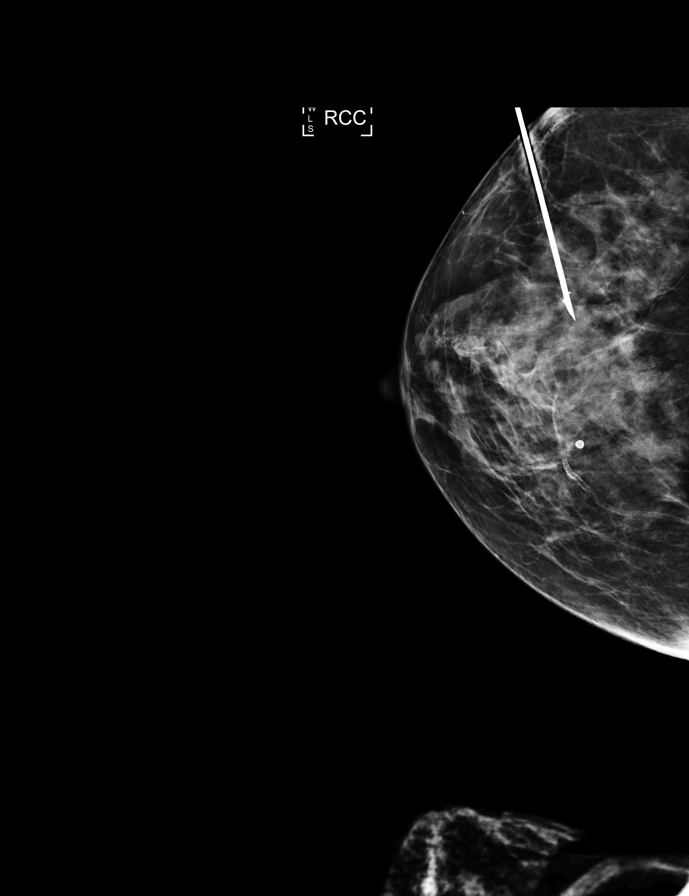

[R LM (2 of 3)]
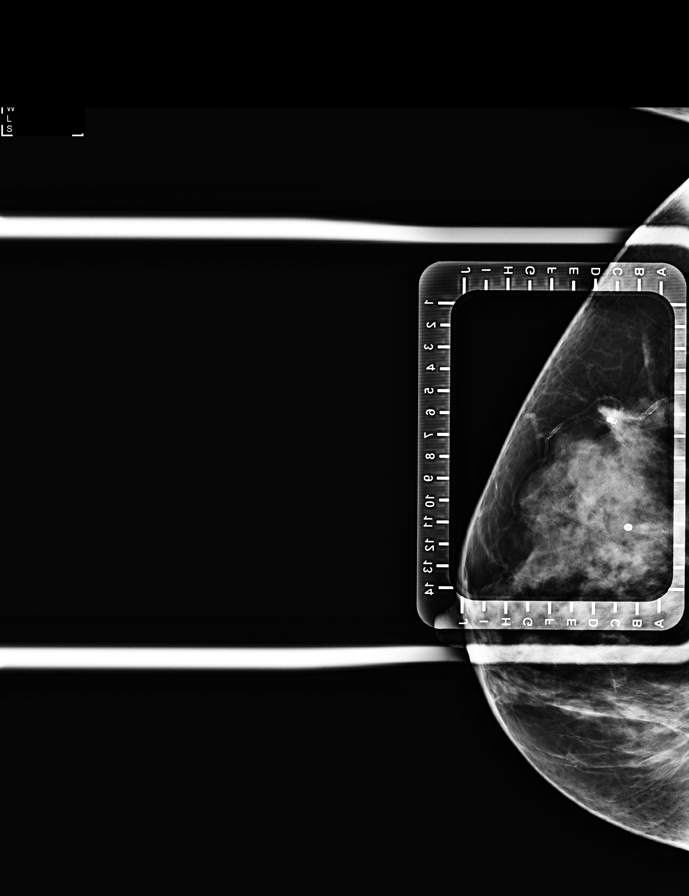

[R CC (3 of 4)]
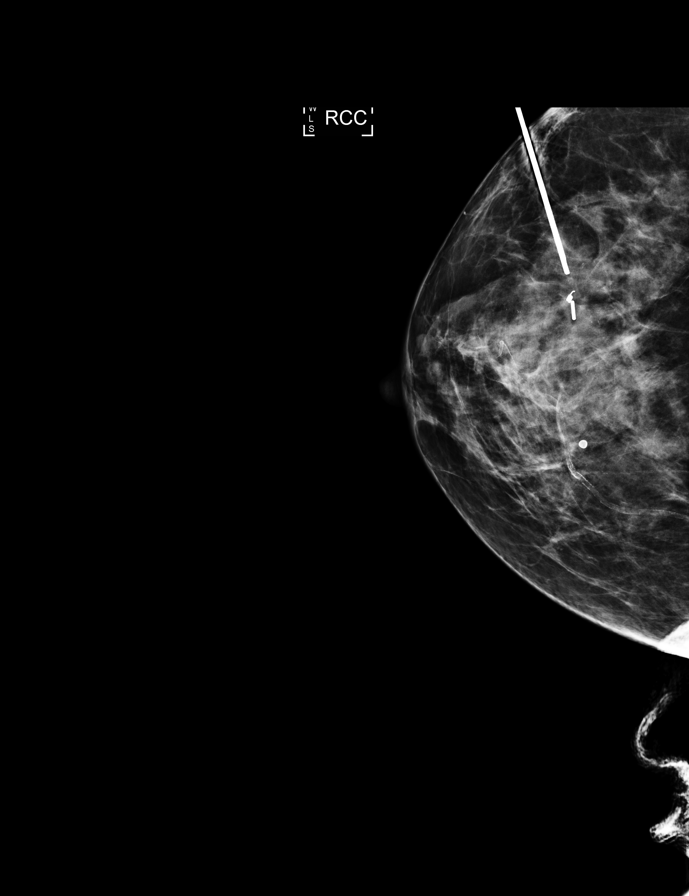

[R CC (4 of 4)]
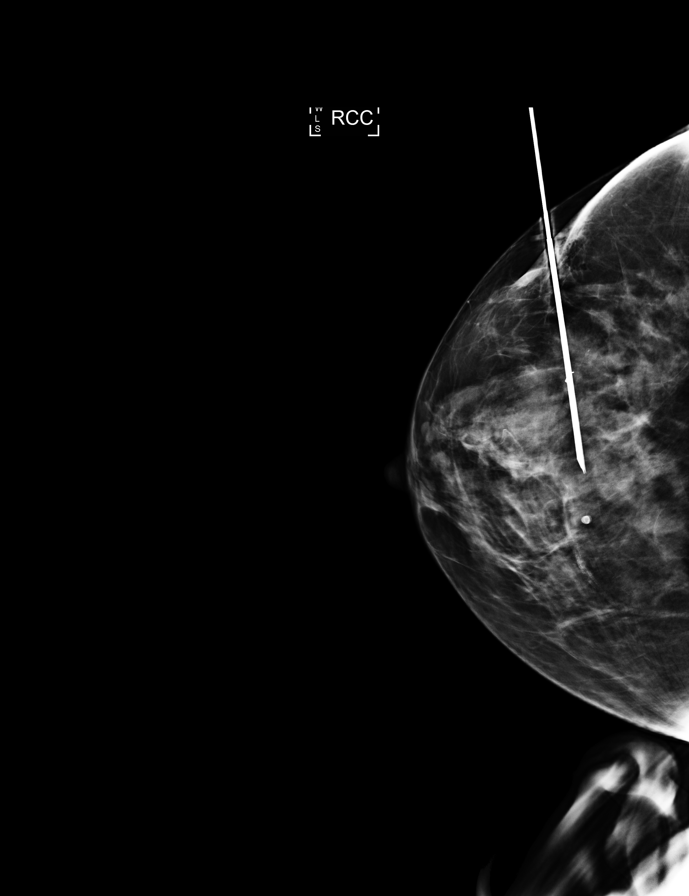

[R LM (3 of 3)]
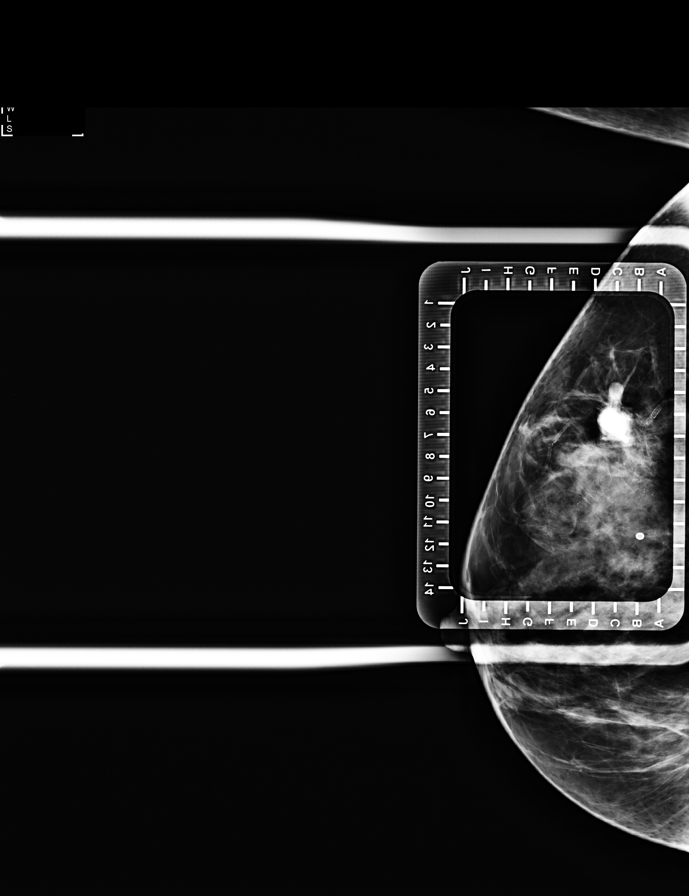

[7 of 7 positions shown; findings below may reference images not displayed]



The usual time-out protocol was performed immediately prior to the
procedure.

Using mammographic guidance, sterile technique, 1% lidocaine and an
I-8DL radioactive seed, the coil shaped clip was localized using a
lateral to medial approach. The follow-up mammogram images confirm
the seed in the expected location and were marked for Dr. Maryline.

Follow-up survey of the patient confirms presence of the radioactive
seed.

Order number of I-8DL seed:  [id number].

Total activity:  0.249 millicuries reference Date: 06/10/2019

The patient tolerated the procedure well and was released from the
[REDACTED]. She was given instructions regarding seed removal.
IMPRESSION: Radioactive seed localization right breast. No apparent
complications.

## 2021-09-05 ENCOUNTER — Telehealth: Payer: Self-pay | Admitting: Cardiology

## 2021-09-05 NOTE — Telephone Encounter (Signed)
Pt c/o Syncope: STAT if syncope occurred within 30 minutes and pt complains of lightheadedness ?High Priority if episode of passing out, completely, today or in last 24 hours  ? ?Did you pass out today?  ?No   ? ?When is the last time you passed out?  ? Patient states she passed out yesterday, 4/17 during church.  ? ?Has this occurred multiple times?  ?Not recently, but she states she has had fainting spells in the past.  ? ?Did you have any symptoms prior to passing out?  ?No, but she states she developed a bad headache afterwards  ? ?

## 2021-09-05 NOTE — Telephone Encounter (Signed)
Spoke with the patient who reports that yesterday at the end of church service she passed out. She reports that she had eaten and drank normal that morning. Only had one cup of coffee. She reports that prior to passing out she felt lightheaded. She told her friend that she felt like she was going to pass out right before. Her friend reported that she was very pale and eyes were glazed. A gentleman who is a Airline pilot came over and assisted and caught her when she passed out. They did call EMS and she was evaluated. She reports that the only thing that was notable was that her BP was low. She reports that ever since she passed out she has had a headache. She feels okay today other than persistent headache. She reports BP of 128/70. Patient is staying hydrated. She states that she has had several other episodes of passing out in the past however these occurred when she was working outside in he heat. She states this was not the case yesterday and it was actually cold in church.  ?

## 2021-09-05 NOTE — Telephone Encounter (Signed)
Patient scheduled for next available appointment.  

## 2021-09-06 ENCOUNTER — Ambulatory Visit: Payer: BC Managed Care – PPO | Admitting: Internal Medicine

## 2021-09-06 VITALS — BP 118/78 | HR 64 | Ht 65.0 in | Wt 133.8 lb

## 2021-09-06 DIAGNOSIS — I471 Supraventricular tachycardia: Secondary | ICD-10-CM | POA: Diagnosis not present

## 2021-09-06 DIAGNOSIS — R55 Syncope and collapse: Secondary | ICD-10-CM | POA: Diagnosis not present

## 2021-09-06 DIAGNOSIS — G43009 Migraine without aura, not intractable, without status migrainosus: Secondary | ICD-10-CM | POA: Diagnosis not present

## 2021-09-06 NOTE — Progress Notes (Signed)
?Cardiology Office Note:   ? ?Date:  09/06/2021  ? ?ID:  Samantha Livingston, DOB [birth year], MRN [id number] ? ?PCP:  Patient, No Pcp Per (Inactive) ?  ?[facility] Providers ?Cardiologist:  Fransico Him, MD    ? ?Referring MD: No ref. provider found  ? ?DOD: Syncope ? ?History of Present Illness:   ? ?Samantha Livingston is a 66 y.o. female with a hx of rare paroxysmal atrial tachycardia, vasovagal, syncope who presents with a DOD visit after a fall.  Hx of breast cancer with surgery and radiation. ?Prior history: Zero CAC 2021,  Mild MR 2021 echo, normal stress 2021, rare AT ziopatch. ? ?Patient notes that she is doing poorly. ?She notes that she blacked out in [facility] Sunday. ?Normal Sunday for her; ate a banana (does not eat much on Sundays).  Service was about over and felt near syncope.Then blacked out. ?EMS eval- BP was low normal.  Had two evaluation for syncope in 2022. ?BP 100/70.   ?Normally has prodrome.  Normally has migraine. ?No symptoms with orthostatics but feels a bit more fatigued  ?AMB BP 120/70 ? ?No chest pain or pressure.  No SOB/DOE and no PND/Orthopnea.  No weight gain or leg swelling.   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 ?Also a stress job. ? ? ? ?Past Medical History:  ?Diagnosis Date  ? Arthritis   ? in left jaw  ? Breast cancer ([place])   ? Family history of breast cancer   ? Family history of melanoma   ? Family history of stomach cancer   ? Irritable bowel syndrome   ? Migraine with aura, without mention of intractable migraine without mention of status migrainosus   ? PAT (paroxysmal atrial tachycardia) ([place])   ? Noted on event monitor 09/2019  ? PVC's (premature ventricular contractions)   ? Noted on event monitor 09/2019  ? ? ?Past Surgical History:  ?Procedure Laterality Date  ? APPENDECTOMY  02-23-2006  ? BREAST LUMPECTOMY    ? BREAST LUMPECTOMY WITH RADIOACTIVE SEED LOCALIZATION Right 07/08/2019  ? Procedure: RIGHT BREAST LUMPECTOMY WITH RADIOACTIVE SEED LOCALIZATION;  Surgeon:  [name], MD;  Location: [place];  Service: General;  Laterality: Right;  ? TONSILLECTOMY  1969  ? TUBAL LIGATION  1983  ? UPPER GASTROINTESTINAL ENDOSCOPY  1980  ? ? ?Current Medications: ?Current Meds  ?Medication Sig  ? Multiple Vitamin (MULTIVITAMIN) tablet Take 1 tablet by mouth daily.  ? rizatriptan (MAXALT) 10 MG tablet Take one tablet by mouth at onset of migraine headaches. May repeat in 2 hours if needed  ? rosuvastatin (CRESTOR) 10 MG tablet Take 10 mg by mouth daily.  ?  ? ?Allergies:   Elemental sulfur  ? ?Social History  ? ?Socioeconomic History  ? Marital status: Widowed  ?  Spouse name: Not on file  ? Number of children: Not on file  ? Years of education: Not on file  ? Highest education level: Not on file  ?Occupational History  ? Not on file  ?Tobacco Use  ? Smoking status: Never  ? Smokeless tobacco: Never  ?Vaping Use  ? Vaping Use: Never used  ?Substance and Sexual Activity  ? Alcohol use: No  ? Drug use: No  ? Sexual activity: Not on file  ?Other Topics Concern  ? Not on file  ?Social History Narrative  ? Husband died 5 after a prolonged illness. Married 20 years. Her 2nd marriage and his 51th.  ? 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  ? ?Social Determinants of Health  ? ?Financial Resource Strain: Not on file  ?Food Insecurity: Not on file  ?Transportation Needs: Not on file  ?Physical Activity: Not on file  ?Stress: Not on file  ?Social Connections: Not on file  ?  ? ?Family History: ?The patient's family history includes Bone cancer in her mother; Breast cancer in her mother; Breast cancer (age of onset: 40) in an other family member; CVA in her mother; Cancer in her maternal grandfather and mother; Melanoma (age of onset: 82) in her brother; Stomach cancer (age of onset: 74) in her maternal aunt. ? ?ROS:   ?Please see the history of present illness.    ? All other systems reviewed and are negative. ? ?EKGs/Labs/Other Studies Reviewed:    ? ?The following studies were reviewed today: ? ?EKG:  EKG is  ordered today.  The ekg ordered today demonstrates  ?09/06/21: SR rate 61 ? ?Recent Labs: ?No results found for requested labs within last 8760 hours.  ?Recent Lipid Panel ?   ?Component Value Date/Time  ? CHOL 261 (H) 03/27/2014 0831  ? TRIG 160 (H) 03/27/2014 0831  ? HDL 62 03/27/2014 0831  ? CHOLHDL 4.2 03/27/2014 0831  ? [place] 167 (H) 03/27/2014 0831  ? ?    ? ?Physical Exam:   ? ?VS:  BP 118/78   Pulse 64   Ht '5\' 5"'$  (1.651 m)   Wt 133 lb 12.8 oz (60.7 kg)   SpO2 97%   BMI 22.27 kg/m?    ? ?Wt Readings from Last 3 Encounters:  ?09/06/21 133 lb 12.8 oz (60.7 kg)  ?06/06/21 140 lb (63.5 kg)  ?12/18/19 137 lb (62.1 kg)  ?  ?Orthostatic Vitals: ?Supine:  BP 129/72  HR 60 ?Sitting:  BP 122/70  HR 63 ?Standing:  BP 115/67  HR 73 ?Prolonged Stand:  BP 117/73  HR 80 ? ?GEN:  Well nourished, well developed in no acute distress ?HEENT: Normal ?NECK: No JVD; No carotid bruits ?LYMPHATICS: No lymphadenopathy ?CARDIAC: RRR, no murmurs, rubs, gallops ?RESPIRATORY:  Clear to auscultation without rales, wheezing or rhonchi  ?ABDOMEN: Soft, non-tender, non-distended ?MUSCULOSKELETAL:  No edema; No deformity  ?SKIN: Warm and dry ?NEUROLOGIC:  Alert and oriented x 3 ?PSYCHIATRIC:  Normal affect  ? ?ASSESSMENT:   ? ?1. Vasovagal syncope   ?2. PAT (paroxysmal atrial tachycardia) ([place])   ?3. Migraine without aura and without status migrainosus, not intractable   ? ?PLAN:   ? ?Near syncope ?Hx Vasovagal syncope ?Hx of P-AT ?HX of migraines ?Orthostatic Hypotension ?- stress the importance of salt and water intake, we have review electrolyte drinks and small frequent meals ?- gave education on slow rise, Valsalva maneuver exacerbation, temperature change ?- discussed muscle contraction and leg crossing ?- we have discussed that medications do exist for therapy but discussed their risk; patient prefers to be on as few medications as possible ?- if true syncope or  more frequent events will repeat echo (had mild MR on last echo 2021) ? ?Has f/u with APP ? ? ?   ? ? ?Medication Adjustments/Labs and Tests Ordered: ?Current medicines are reviewed at length with the patient today.  Concerns regarding medicines are outlined above.  ?Orders Placed This Encounter  ?Procedures  ? EKG 12-Lead  ? ?No orders of the defined types were placed in this encounter. ? ? ?Patient Instructions  ?Medication Instructions:  ?Your physician recommends that you continue on  your current medications as directed. Please refer to the Current Medication list given to you today. ? ?*If you need a refill on your cardiac medications before your next appointment, please call your pharmacy* ? ? ?Lab Work: ?NONE ?If you have labs (blood work) drawn today and your tests are completely normal, you will receive your results only by: ?MyChart Message (if you have MyChart) OR ?A paper copy in the mail ?If you have any lab test that is abnormal or we need to change your treatment, we will call you to review the results. ? ? ?Testing/Procedures: ?NONE ? ? ?Follow-Up: ?At [facility], you and your health needs are our priority.  As part of our continuing mission to provide you with exceptional heart care, we have created designated Provider Care Teams.  These Care Teams include your primary Cardiologist (physician) and Advanced Practice Providers (APPs -  Physician Assistants and Nurse Practitioners) who all work together to provide you with the care you need, when you need it. ? ?We recommend signing up for the patient portal called "MyChart".  Sign up information is provided on this After Visit Summary.  MyChart is used to connect with patients for Virtual Visits (Telemedicine).  Patients are able to view lab/test results, encounter notes, upcoming appointments, etc.  Non-urgent messages can be sent to your provider as well.   ?To learn more about what you can do with MyChart, go to [email or web address].    ? ?Your next appointment:   ?4 months ? ?The format for your next appointment:   ?In Person ? ?Provider:   ?Samantha Ku, PA ? ? ? ?Important Information About [place] ? ? ? ? ?   ? ?Signed, ?Werner Lean, MD

## 2021-09-06 NOTE — Patient Instructions (Addendum)
Medication Instructions:  ?Your physician recommends that you continue on your current medications as directed. Please refer to the Current Medication list given to you today. ? ?*If you need a refill on your cardiac medications before your next appointment, please call your pharmacy* ? ? ?Lab Work: ?NONE ?If you have labs (blood work) drawn today and your tests are completely normal, you will receive your results only by: ?MyChart Message (if you have MyChart) OR ?A paper copy in the mail ?If you have any lab test that is abnormal or we need to change your treatment, we will call you to review the results. ? ? ?Testing/Procedures: ?NONE ? ? ?Follow-Up: ?At [facility], you and your health needs are our priority.  As part of our continuing mission to provide you with exceptional heart care, we have created designated Provider Care Teams.  These Care Teams include your primary Cardiologist (physician) and Advanced Practice Providers (APPs -  Physician Assistants and Nurse Practitioners) who all work together to provide you with the care you need, when you need it. ? ?We recommend signing up for the patient portal called "MyChart".  Sign up information is provided on this After Visit Summary.  MyChart is used to connect with patients for Virtual Visits (Telemedicine).  Patients are able to view lab/test results, encounter notes, upcoming appointments, etc.  Non-urgent messages can be sent to your provider as well.   ?To learn more about what you can do with MyChart, go to [email or web address].   ? ?Your next appointment:   ?4 months ? ?The format for your next appointment:   ?In Person ? ?Provider:   ?Sharrell Ku, PA ? ? ? ?Important Information About [place] ? ? ? ? ?  ?

## 2021-10-12 ENCOUNTER — Ambulatory Visit: Payer: BC Managed Care – PPO | Admitting: Physician Assistant

## 2022-01-09 NOTE — Progress Notes (Unsigned)
Date:  01/10/2022   ID:  Samantha Livingston, DOB 10-18-1955, MRN [id number] The patient was identified using 2 identifiers.  PCP:  Patient, No Pcp Per   [facility] Providers Cardiologist:  Fransico Him, MD     Evaluation Performed:  Follow-Up Visit  Chief Complaint:  SOB  History of Present Illness:    Samantha Livingston is a 66 y.o. female with a history of migraines and breast cancer.  She was seen in the past for shortness of breath and palpitations.  She was fine before starting XRT for her breast cancer and her symptoms started right after her first treatment for XRT.  2D echo 10/2019 showed normal LV function with EF 60 to 65% with normal diastolic function and mild MR. Exercise treadmill showed no ischemia.Event monitor showed normal sinus rhythm with occasional PVCs, ventricular couplets, PACs and nonsustained atrial tachycardia up to 8 beats.  Electrolytes including magnesium and potassium were normal at that time and TSH was normal.  She did not want to go on any medications to suppress her arrhythmias.  Coronary Ca score was also done which was 0.  She is here today for followup and is doing well.  She denies any chest pain or pressure, SOB, DOE, PND, orthopnea, LE edema, dizziness, palpitations or syncope. She is compliant with her meds and is tolerating meds with no SE.     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   Breast cancer ([place])    Family history of breast cancer    Family history of melanoma    Family history of stomach cancer    Irritable bowel syndrome    Migraine with aura, without mention of intractable migraine without mention of status migrainosus    PAT (paroxysmal atrial tachycardia) ([place])    Noted on event monitor 09/2019   PVC's (premature ventricular contractions)    Noted on event monitor 09/2019   Past Surgical History:  Procedure Laterality Date   APPENDECTOMY  02-23-2006   BREAST LUMPECTOMY     BREAST LUMPECTOMY WITH  RADIOACTIVE SEED LOCALIZATION Right 07/08/2019   Procedure: RIGHT BREAST LUMPECTOMY WITH RADIOACTIVE SEED LOCALIZATION;  Surgeon: [name], MD;  Location: [place];  Service: General;  Laterality: Right;   [place] [facility]  1980     Current Meds  Medication Sig   Multiple Vitamin (MULTIVITAMIN) tablet Take 1 tablet by mouth daily.   rizatriptan (MAXALT) 10 MG tablet Take one tablet by mouth at onset of migraine headaches. May repeat in 2 hours if needed     Allergies:   Elemental sulfur   Social History   Tobacco Use   Smoking status: Never   Smokeless tobacco: Never  Vaping Use   Vaping Use: Never used  Substance Use Topics   Alcohol use: No   Drug use: No     Family Hx: The patient's family history includes Bone cancer in her mother; Breast cancer in her mother; Breast cancer (age of onset: 9) in an other family member; CVA in her mother; Cancer in her maternal grandfather and mother; Melanoma (age of onset: 40) in her brother; Stomach cancer (age of onset: 35) in her maternal aunt.  ROS:   Please see the history of present illness.     All other systems reviewed and are negative.   Prior CV studies:   The following  studies were reviewed today:  2D echo, event monitor, ETT  Labs/Other Tests and Data Reviewed:    EKG:  No ECG reviewed.  Recent Labs: No results found for requested labs within last 365 days.   Recent Lipid Panel Lab Results  Component Value Date/Time   CHOL 261 (H) 03/27/2014 08:31 AM   TRIG 160 (H) 03/27/2014 08:31 AM   HDL 62 03/27/2014 08:31 AM   CHOLHDL 4.2 03/27/2014 08:31 AM   LDLCALC 167 (H) 03/27/2014 08:31 AM    Wt Readings from Last 3 Encounters:  01/10/22 126 lb (57.2 kg)  09/06/21 133 lb 12.8 oz (60.7 kg)  06/06/21 140 lb (63.5 kg)     Risk Assessment/Calculations:          Objective:    Vital Signs:  BP 118/78   Pulse 66   Ht 5'  5" (1.651 m)   Wt 126 lb (57.2 kg)   SpO2 95%   BMI 20.97 kg/m   GEN: Well nourished, well developed in no acute distress HEENT: Normal NECK: No JVD; No carotid bruits LYMPHATICS: No lymphadenopathy CARDIAC:RRR, no murmurs, rubs, gallops RESPIRATORY:  Clear to auscultation without rales, wheezing or rhonchi  ABDOMEN: Soft, non-tender, non-distended MUSCULOSKELETAL:  No edema; No deformity  SKIN: Warm and dry NEUROLOGIC:  Alert and oriented x 3 PSYCHIATRIC:  Normal affect    ASSESSMENT/PLAN: Shortness of breath -This occurred after her first treatment of XRT -2D echocardiogram showed normal LV function with EF 60 to 65% and normal diastolic function -Exercise test showed no ischemia and coronary Ca score was 0  2.  Palpitations/PVCs/nonsustained atrial tachycardia -Event monitor showed occasional PVCs, bigeminal PVCs and nonsustained atrial tachycardia up to 8 beats in a row -Labs including potassium magnesium and TSH were normal -Normal LV function on echo -She did not want to go on any type of beta-blocker or CCB to suppress these -Encouraged her to avoid alcohol and caffeine -she has not had any palpitations after cutting back on caffeine  3.  Presyncope -this occurred in the setting of working outside in the extreme heat and sounds orthostatic likely related to dehydration -I encouraged her to stay hydrated and avoid working out in the heat -she denies any further dizziness or syncope  4.  HLD -her LDL was in the 160's in 2015 and recently had her lipids repeated recently by her GYN and was elevated and placed on Crestor '20mg'$  daily -I have personally reviewed and interpreted outside labs performed by patient's PCP which showed LDL 75, HDL 64 on 06/09/2021  Time:   Today, I have spent 20 minutes with the patient with telehealth technology discussing the above problems.     Medication Adjustments/Labs and Tests Ordered: Current medicines are reviewed at length with the  patient today.  Concerns regarding medicines are outlined above.   Tests Ordered: No orders of the defined types were placed in this encounter.   Medication Changes: No orders of the defined types were placed in this encounter.   Follow Up:  In Person in 1 year(s)  Signed, Fransico Him, MD  01/10/2022 9:10 AM    [place]

## 2022-01-10 ENCOUNTER — Ambulatory Visit: Payer: BC Managed Care – PPO | Admitting: Cardiology

## 2022-01-10 ENCOUNTER — Encounter: Payer: Self-pay | Admitting: Cardiology

## 2022-01-10 VITALS — BP 118/78 | HR 66 | Ht 65.0 in | Wt 126.0 lb

## 2022-01-10 DIAGNOSIS — R55 Syncope and collapse: Secondary | ICD-10-CM

## 2022-01-10 DIAGNOSIS — I471 Supraventricular tachycardia: Secondary | ICD-10-CM

## 2022-01-10 DIAGNOSIS — R0602 Shortness of breath: Secondary | ICD-10-CM | POA: Diagnosis not present

## 2022-01-10 DIAGNOSIS — E782 Mixed hyperlipidemia: Secondary | ICD-10-CM

## 2022-01-10 DIAGNOSIS — I493 Ventricular premature depolarization: Secondary | ICD-10-CM

## 2022-01-10 NOTE — Patient Instructions (Signed)

## 2022-01-16 ENCOUNTER — Ambulatory Visit: Payer: BC Managed Care – PPO | Admitting: Internal Medicine

## 2022-02-20 ENCOUNTER — Other Ambulatory Visit: Payer: Self-pay | Admitting: Obstetrics & Gynecology

## 2022-02-20 DIAGNOSIS — Z853 Personal history of malignant neoplasm of breast: Secondary | ICD-10-CM

## 2022-05-05 ENCOUNTER — Ambulatory Visit
Admission: RE | Admit: 2022-05-05 | Discharge: 2022-05-05 | Disposition: A | Discharge status: Home / Self Care | Payer: BC Managed Care – PPO | Source: Ambulatory Visit | Attending: Obstetrics & Gynecology | Admitting: Obstetrics & Gynecology

## 2022-05-05 DIAGNOSIS — Z853 Personal history of malignant neoplasm of breast: Secondary | ICD-10-CM

## 2023-03-07 ENCOUNTER — Other Ambulatory Visit: Payer: Self-pay | Admitting: Obstetrics & Gynecology

## 2023-03-07 DIAGNOSIS — Z853 Personal history of malignant neoplasm of breast: Secondary | ICD-10-CM

## 2023-05-08 ENCOUNTER — Ambulatory Visit
Admission: RE | Admit: 2023-05-08 | Discharge: 2023-05-08 | Disposition: A | Discharge status: Home / Self Care | Payer: Medicare Other | Source: Ambulatory Visit | Attending: Obstetrics & Gynecology | Admitting: Obstetrics & Gynecology

## 2023-05-08 DIAGNOSIS — Z853 Personal history of malignant neoplasm of breast: Secondary | ICD-10-CM

## 2023-05-08 HISTORY — DX: Personal history of irradiation: Z92.3

## 2023-06-20 LAB — COLOGUARD: COLOGUARD: NEGATIVE

## 2024-03-19 ENCOUNTER — Other Ambulatory Visit: Payer: Self-pay | Admitting: Obstetrics & Gynecology

## 2024-03-19 DIAGNOSIS — Z1231 Encounter for screening mammogram for malignant neoplasm of breast: Secondary | ICD-10-CM

## 2024-05-08 ENCOUNTER — Inpatient Hospital Stay
Admission: RE | Admit: 2024-05-08 | Discharge: 2024-05-08 | Attending: Obstetrics & Gynecology | Admitting: Obstetrics & Gynecology

## 2024-05-08 DIAGNOSIS — Z1231 Encounter for screening mammogram for malignant neoplasm of breast: Secondary | ICD-10-CM
